# Patient Record
Sex: Female | Born: 1965 | Race: Black or African American | Hispanic: No | Marital: Single | Smoking: Never smoker
Health system: Southern US, Community
[De-identification: ages and names within clinical notes are randomized; demographics above are authoritative.]

## PROBLEM LIST (undated history)

## (undated) DIAGNOSIS — E049 Nontoxic goiter, unspecified: Secondary | ICD-10-CM

## (undated) HISTORY — DX: Nontoxic goiter, unspecified: E04.9

---

## 2001-07-29 ENCOUNTER — Emergency Department (HOSPITAL_COMMUNITY): Admission: EM | Admit: 2001-07-29 | Discharge: 2001-07-29 | Payer: Self-pay | Admitting: Emergency Medicine

## 2001-07-29 ENCOUNTER — Encounter: Payer: Self-pay | Admitting: Emergency Medicine

## 2007-07-16 ENCOUNTER — Emergency Department (HOSPITAL_COMMUNITY): Admission: EM | Admit: 2007-07-16 | Discharge: 2007-07-16 | Payer: Self-pay | Admitting: Emergency Medicine

## 2007-12-26 ENCOUNTER — Emergency Department (HOSPITAL_COMMUNITY): Admission: EM | Admit: 2007-12-26 | Discharge: 2007-12-26 | Payer: Self-pay | Admitting: Family Medicine

## 2011-01-08 ENCOUNTER — Other Ambulatory Visit: Payer: Self-pay | Admitting: Family Medicine

## 2011-01-08 DIAGNOSIS — N926 Irregular menstruation, unspecified: Secondary | ICD-10-CM

## 2011-01-13 ENCOUNTER — Other Ambulatory Visit: Payer: Self-pay

## 2011-01-26 ENCOUNTER — Other Ambulatory Visit: Payer: Self-pay

## 2011-01-26 ENCOUNTER — Ambulatory Visit
Admission: RE | Admit: 2011-01-26 | Discharge: 2011-01-26 | Disposition: A | Discharge status: Home / Self Care | Payer: 59 | Source: Ambulatory Visit | Attending: Family Medicine | Admitting: Family Medicine

## 2011-01-26 DIAGNOSIS — N926 Irregular menstruation, unspecified: Secondary | ICD-10-CM

## 2011-07-23 ENCOUNTER — Ambulatory Visit (INDEPENDENT_AMBULATORY_CARE_PROVIDER_SITE_OTHER): Payer: 59

## 2011-07-23 DIAGNOSIS — G44209 Tension-type headache, unspecified, not intractable: Secondary | ICD-10-CM

## 2011-07-23 DIAGNOSIS — J019 Acute sinusitis, unspecified: Secondary | ICD-10-CM

## 2011-07-23 DIAGNOSIS — B9789 Other viral agents as the cause of diseases classified elsewhere: Secondary | ICD-10-CM

## 2011-07-24 ENCOUNTER — Ambulatory Visit (INDEPENDENT_AMBULATORY_CARE_PROVIDER_SITE_OTHER): Payer: 59

## 2011-07-24 DIAGNOSIS — R51 Headache: Secondary | ICD-10-CM

## 2011-07-24 DIAGNOSIS — R112 Nausea with vomiting, unspecified: Secondary | ICD-10-CM

## 2011-10-01 ENCOUNTER — Encounter: Payer: Self-pay | Admitting: Family Medicine

## 2012-08-31 ENCOUNTER — Ambulatory Visit: Payer: 59 | Admitting: Family Medicine

## 2012-08-31 VITALS — BP 140/92 | HR 86 | Temp 98.1°F | Resp 16 | Ht 63.5 in | Wt 181.0 lb

## 2012-08-31 DIAGNOSIS — R5381 Other malaise: Secondary | ICD-10-CM

## 2012-08-31 DIAGNOSIS — J029 Acute pharyngitis, unspecified: Secondary | ICD-10-CM

## 2012-08-31 LAB — POCT CBC
Granulocyte percent: 44.1 %G (ref 37–80)
HCT, POC: 42.4 % (ref 37.7–47.9)
Hemoglobin: 13.5 g/dL (ref 12.2–16.2)
Lymph, poc: 1.8 (ref 0.6–3.4)
MCH, POC: 29.8 pg (ref 27–31.2)
MCHC: 31.8 g/dL (ref 31.8–35.4)
MCV: 93.6 fL (ref 80–97)
MID (cbc): 0.6 (ref 0–0.9)
MPV: 10.5 fL (ref 0–99.8)
POC Granulocyte: 1.9 — AB (ref 2–6.9)
POC LYMPH PERCENT: 42.3 %L (ref 10–50)
POC MID %: 13.6 %M — AB (ref 0–12)
Platelet Count, POC: 174 10*3/uL (ref 142–424)
RBC: 4.53 M/uL (ref 4.04–5.48)
RDW, POC: 15.6 %
WBC: 4.3 10*3/uL — AB (ref 4.6–10.2)

## 2012-08-31 MED ORDER — FIRST-DUKES MOUTHWASH MT SUSP: 5.0000 mL | Freq: Four times a day (QID) | OROMUCOSAL | Status: DC | PRN

## 2012-08-31 NOTE — Patient Instructions (Addendum)
Rest, drink plenty of fluids, and let me know if you are not better in the next 2 days.  You can use ibuprofen and/ or tylenol for your aches, and the throat wash as needed.   Let me know sooner if you get worse

## 2012-08-31 NOTE — Progress Notes (Signed)
[facility] [address] [place] [address] [phone]  Date:  08/31/2012   Name:  Tammy Dixon   DOB:  Feb 24, 1966   MRN:  [id number]  PCP:  No primary provider on file.    Chief Complaint: Sore Throat, Generalized Body Aches, Cough, Headache and Chills   History of Present Illness:  Tammy Dixon is a 47 y.o. very pleasant female patient who presents with the following:  Here today with illness which started on Sunday- today is Wednesday.   She has noted a sore throat, HA, pressure behind her eyes, sore/ tight muscles, hot and cold flashes.  She feels very tired.  She has a mild cough.   She uses an OTC sinus medication for this- however they seemed to make her feel worse No GI symptoms.   No medications yet today.    She is generally healthy.  Codeine causes nausea and vomiting but not itching/ swelling   There is no problem list on file for this patient.   History reviewed. No pertinent past medical history.  History reviewed. No pertinent past surgical history.  History  Substance Use Topics  . Smoking status: Never Smoker   . Smokeless tobacco: Not on file  . Alcohol Use: No    History reviewed. No pertinent family history.  Allergies  Allergen Reactions  . Codeine Nausea And Vomiting    Medication list has been reviewed and updated.  No current outpatient prescriptions on file prior to visit.    Review of Systems:  As per HPI- otherwise negative.   Physical Examination: Filed Vitals:   08/31/12 1228  BP: 140/92  Pulse: 86  Resp: 16   Filed Vitals:   08/31/12 1228  Height: 5' 3.5" (1.613 m)  Weight: 181 lb (82.101 kg)   Body mass index is 31.56 kg/(m^2). Ideal Body Weight: Weight in (lb) to have BMI = 25: 143.1   GEN: WDWN, NAD, Non-toxic, A & O x 3 HEENT: Atraumatic, Normocephalic. Neck supple. No masses, No LAD. Ears and Nose: No external deformity. CV: RRR, No M/G/R. No JVD. No thrill. No extra heart  sounds. PULM: CTA B, no wheezes, crackles, rhonchi. No retractions. No resp. distress. No accessory muscle use. ABD: S, NT, ND, +BS. No rebound. No HSM. EXTR: No c/c/e NEURO Normal gait.  PSYCH: Normally interactive. Conversant. Not depressed or anxious appearing.  Calm demeanor.   Results for orders placed in visit on 08/31/12  POCT CBC      Component Value Range   WBC 4.3 (*) 4.6 - 10.2 K/uL   Lymph, poc 1.8  0.6 - 3.4   POC LYMPH PERCENT 42.3  10 - 50 %L   MID (cbc) 0.6  0 - 0.9   POC MID % 13.6 (*) 0 - 12 %M   POC Granulocyte 1.9 (*) 2 - 6.9   Granulocyte percent 44.1  37 - 80 %G   RBC 4.53  4.04 - 5.48 M/uL   Hemoglobin 13.5  12.2 - 16.2 g/dL   HCT, POC 52.8  41.3 - 47.9 %   MCV 93.6  80 - 97 fL   MCH, POC 29.8  27 - 31.2 pg   MCHC 31.8  31.8 - 35.4 g/dL   RDW, POC 24.4     Platelet Count, POC 174  142 - 424 K/uL   MPV 10.5  0 - 99.8 fL    Assessment and Plan: 1. Malaise  POCT CBC  2.  Sore throat  Diphenhyd-Hydrocort-Nystatin (FIRST-DUKES MOUTHWASH) SUSP   Here today with illness- suspect she has had the flu.  She perfers not to undergo a nasal swab today.  It is too late for tamiflu to be very helpful.  Will treat with DMM prn, and rest/ supportive care.  She is to let me know if not better in the next 2 days, Sooner if worse.     Abbe Amsterdam, MD

## 2012-09-02 ENCOUNTER — Ambulatory Visit: Payer: 59 | Admitting: Internal Medicine

## 2012-09-02 VITALS — BP 138/94 | HR 115 | Temp 99.6°F | Resp 17 | Ht 65.0 in | Wt 177.0 lb

## 2012-09-02 DIAGNOSIS — J329 Chronic sinusitis, unspecified: Secondary | ICD-10-CM

## 2012-09-02 DIAGNOSIS — J111 Influenza due to unidentified influenza virus with other respiratory manifestations: Secondary | ICD-10-CM

## 2012-09-02 DIAGNOSIS — H66009 Acute suppurative otitis media without spontaneous rupture of ear drum, unspecified ear: Secondary | ICD-10-CM

## 2012-09-02 DIAGNOSIS — R110 Nausea: Secondary | ICD-10-CM

## 2012-09-02 MED ORDER — AMOXICILLIN 875 MG PO TABS: 875.0000 mg | ORAL_TABLET | Freq: Two times a day (BID) | ORAL | Status: DC

## 2012-09-02 MED ORDER — MELOXICAM 15 MG PO TABS: 15.0000 mg | ORAL_TABLET | Freq: Every day | ORAL | Status: DC

## 2012-09-02 MED ORDER — ONDANSETRON HCL 4 MG PO TABS
8.0000 mg | ORAL_TABLET | Freq: Once | ORAL | Status: AC
  Administered 2012-09-02: 8 mg via ORAL

## 2012-09-02 MED ORDER — ONDANSETRON HCL 4 MG PO TABS: 4.0000 mg | ORAL_TABLET | Freq: Three times a day (TID) | ORAL | Status: DC | PRN

## 2012-09-04 NOTE — Progress Notes (Signed)
  Subjective:    Patient ID: Tammy Dixon, female    DOB: [birth year], 47 y.o.   MRN: [id number]  HPIsee ov 1/22 She is worse now with nausea, continued malaise, mild sore throat, headache, aching in muscles all over particularly shoulders, and fatigue. She continues to have chills and intermittent fever. There is no vomiting. Cough is minimal. She has had no vomiting. The headache is mainly frontal. It's worse with bending over. She has significant congestion and trouble breathing at night. She is purulent discharge. She has tried to work the last 2 days and is greatly fatigued and aching all over the  Review of Systems     Objective:   Physical Exam No acute distress Temperature 99.6 eyes clear/TMs clear/nares congested with purulent mucus /bilateral maxillary sinus tenderness tenderness to percussion  throat clear/no nodes Chest clear to auscultation Heart regular with pulse of 100/no murmur Good range of motion of neck and shoulder Abdomen is benign      Assessment & Plan:  Impression #1 influenza  #2 sinus infection as a complication  #3 nausea as a complication Given 8 mg of Zofran in the clinic 2 push clear liquids at home and advance as tolerated Amoxicillin 875 twice a day Zofran 4 mg every 6-8 hours as needed Mobic 15 mg daily if needed for headache or myalgias Recheck in 5 days if not well Out of work until Tuesday

## 2013-04-12 ENCOUNTER — Ambulatory Visit: Payer: 59

## 2013-04-12 ENCOUNTER — Ambulatory Visit: Payer: 59 | Admitting: Family Medicine

## 2013-04-12 VITALS — BP 118/86 | HR 64 | Temp 98.2°F | Resp 18 | Ht 63.0 in | Wt 181.0 lb

## 2013-04-12 DIAGNOSIS — M25569 Pain in unspecified knee: Secondary | ICD-10-CM

## 2013-04-12 DIAGNOSIS — R52 Pain, unspecified: Secondary | ICD-10-CM

## 2013-04-12 DIAGNOSIS — M722 Plantar fascial fibromatosis: Secondary | ICD-10-CM

## 2013-04-12 DIAGNOSIS — M25561 Pain in right knee: Secondary | ICD-10-CM

## 2013-04-12 LAB — COMPREHENSIVE METABOLIC PANEL WITH GFR
ALT: 14 U/L (ref 0–35)
AST: 19 U/L (ref 0–37)
Albumin: 4.5 g/dL (ref 3.5–5.2)
Alkaline Phosphatase: 77 U/L (ref 39–117)
Potassium: 4 meq/L (ref 3.5–5.3)
Sodium: 139 meq/L (ref 135–145)
Total Bilirubin: 0.5 mg/dL (ref 0.3–1.2)
Total Protein: 7.2 g/dL (ref 6.0–8.3)

## 2013-04-12 LAB — COMPREHENSIVE METABOLIC PANEL
BUN: 10 mg/dL (ref 6–23)
CO2: 27 mEq/L (ref 19–32)
Calcium: 9.3 mg/dL (ref 8.4–10.5)
Chloride: 105 mEq/L (ref 96–112)
Creat: 0.61 mg/dL (ref 0.50–1.10)
Glucose, Bld: 73 mg/dL (ref 70–99)

## 2013-04-12 LAB — POCT GLYCOSYLATED HEMOGLOBIN (HGB A1C): Hemoglobin A1C: 5.2

## 2013-04-12 MED ORDER — MELOXICAM 7.5 MG PO TABS: 7.5000 mg | ORAL_TABLET | Freq: Every day | ORAL | Status: DC

## 2013-04-12 NOTE — Progress Notes (Signed)
[facility]:  Office Visit  Chief Complaint:  Chief Complaint  Patient presents with  . Leg Pain    both legs- 2 weeks    HPI: Tammy Dixon is a 47 y.o. female who complains of  2 week history of bilateral leg pain, "stiffness and burning sensation". She has this with or without walking, exertion or at rest. She denies DM. She denies STDs. She denies fevers, chills, eye problems, vision issues, or rashes. Has tried tylenol and ibuprofen . She has AM pain. No prior leg pain. She also has morning pain when she wakes up before she puts her feet on the ground, the pain is sometimes at the bottom of her feet bilaterally, she has pain in bilateral calf without swelling, she walks a lot for her job. No new foot wear. No risk factors for DVT, no swelling, aysmmetry.  History reviewed. No pertinent past medical history. History reviewed. No pertinent past surgical history. History   Social History  . Marital Status: Single    Spouse Name: N/A    Number of Children: N/A  . Years of Education: N/A   Social History Main Topics  . Smoking status: Never Smoker   . Smokeless tobacco: None  . Alcohol Use: No  . Drug Use: None  . Sexual Activity: None   Other Topics Concern  . None   Social History Narrative  . None   No family history on file. Allergies  Allergen Reactions  . Codeine Nausea And Vomiting   Prior to Admission medications   Not on File     ROS: The patient denies fevers, chills, night sweats, unintentional weight loss, chest pain, palpitations, wheezing, dyspnea on exertion, nausea, vomiting, abdominal pain, dysuria, hematuria, melena, weakness  All other systems have been reviewed and were otherwise negative with the exception of those mentioned in the HPI and as above.    PHYSICAL EXAM: Filed Vitals:   04/12/13 1310  BP: 118/86  Pulse: 64  Temp: 98.2 F (36.8 C)  Resp: 18   Filed Vitals:   04/12/13 1310  Height: 5\' 3"  (1.6 m)   Weight: 181 lb (82.101 kg)   Body mass index is 32.07 kg/(m^2).  General: Alert, no acute distress HEENT:  Normocephalic, atraumatic, oropharynx patent. EOMI, PERRLA Cardiovascular:  Regular rate and rhythm, no rubs murmurs or gallops.  No Carotid bruits, radial pulse intact. No pedal edema.  Respiratory: Clear to auscultation bilaterally.  No wheezes, rales, or rhonchi.  No cyanosis, no use of accessory musculature GI: No organomegaly, abdomen is soft and non-tender, positive bowel sounds.  No masses. Skin: No rashes. Neurologic: Facial musculature symmetric. Psychiatric: Patient is appropriate throughout our interaction. Lymphatic: No cervical lymphadenopathy Musculoskeletal: Gait intact. She has lateral knee tenderness along patellar bilaterally 5/5 strength, neg anterior drawer, mcmurray, lachman tests, stable to varus and valgus sress.   + DP, posterior tib, good cap refill. No crepitus or swelling  + tenderness along medial aspect of PF and also along achilles bilateraly NOrmal Dorsi and plantar flexion without pain   LABS: Results for orders placed in visit on 04/12/13  POCT GLYCOSYLATED HEMOGLOBIN (HGB A1C)      Result Value Range   Hemoglobin A1C 5.2       EKG/XRAY:   Primary read interpreted by Dr. Conley Rolls at [facility]. No fx.dislocation   ASSESSMENT/PLAN:3 Encounter Diagnoses  Name Primary?  . Burning pain Yes  . Knee pain, bilateral   . Plantar fasciitis, bilateral  Rx mobic PT if no improvement PF exercise and change shoes, encourage weight loss. ? Combination of PF and mild arthritic changes of knee. She does not have DM. I did not screen her for STDs.  Gross sideeffects, risk and benefits, and alternatives of medications d/w patient. Patient is aware that all medications have potential sideeffects and we are unable to predict every sideeffect or drug-drug interaction that may occur.  Torres Hardenbrook PHUONG, DO 04/12/2013 3:12 PM

## 2013-04-12 NOTE — Patient Instructions (Addendum)
Plantar Fasciitis (Heel Spur Syndrome) with Rehab The plantar fascia is a fibrous, ligament-like, soft-tissue structure that spans the bottom of the foot. Plantar fasciitis is a condition that causes pain in the foot due to inflammation of the tissue. SYMPTOMS   Pain and tenderness on the underneath side of the foot.  Pain that worsens with standing or walking. CAUSES  Plantar fasciitis is caused by irritation and injury to the plantar fascia on the underneath side of the foot. Common mechanisms of injury include:  Direct trauma to bottom of the foot.  Damage to a small nerve that runs under the foot where the main fascia attaches to the heel bone.  Stress placed on the plantar fascia due to bone spurs. RISK INCREASES WITH:   Activities that place stress on the plantar fascia (running, jumping, pivoting, or cutting).  Poor strength and flexibility.  Improperly fitted shoes.  Tight calf muscles.  Flat feet.  Failure to warm-up properly before activity.  Obesity. PREVENTION  Warm up and stretch properly before activity.  Allow for adequate recovery between workouts.  Maintain physical fitness:  Strength, flexibility, and endurance.  Cardiovascular fitness.  Maintain a health body weight.  Avoid stress on the plantar fascia.  Wear properly fitted shoes, including arch supports for individuals who have flat feet. PROGNOSIS  If treated properly, then the symptoms of plantar fasciitis usually resolve without surgery. However, occasionally surgery is necessary. RELATED COMPLICATIONS   Recurrent symptoms that may result in a chronic condition.  Problems of the lower back that are caused by compensating for the injury, such as limping.  Pain or weakness of the foot during push-off following surgery.  Chronic inflammation, scarring, and partial or complete fascia tear, occurring more often from repeated injections. TREATMENT  Treatment initially involves the use of  ice and medication to help reduce pain and inflammation. The use of strengthening and stretching exercises may help reduce pain with activity, especially stretches of the Achilles tendon. These exercises may be performed at home or with a therapist. Your caregiver may recommend that you use heel cups of arch supports to help reduce stress on the plantar fascia. Occasionally, corticosteroid injections are given to reduce inflammation. If symptoms persist for greater than 6 months despite non-surgical (conservative), then surgery may be recommended.  MEDICATION   If pain medication is necessary, then nonsteroidal anti-inflammatory medications, such as aspirin and ibuprofen, or other minor pain relievers, such as acetaminophen, are often recommended.  Do not take pain medication within 7 days before surgery.  Prescription pain relievers may be given if deemed necessary by your caregiver. Use only as directed and only as much as you need.  Corticosteroid injections may be given by your caregiver. These injections should be reserved for the most serious cases, because they may only be given a certain number of times. HEAT AND COLD  Cold treatment (icing) relieves pain and reduces inflammation. Cold treatment should be applied for 10 to 15 minutes every 2 to 3 hours for inflammation and pain and immediately after any activity that aggravates your symptoms. Use ice packs or massage the area with a piece of ice (ice massage).  Heat treatment may be used prior to performing the stretching and strengthening activities prescribed by your caregiver, physical therapist, or athletic trainer. Use a heat pack or soak the injury in warm water. SEEK IMMEDIATE MEDICAL CARE IF:  Treatment seems to offer no benefit, or the condition worsens.  Any medications produce adverse side effects. EXERCISES RANGE   OF MOTION (ROM) AND STRETCHING EXERCISES - Plantar Fasciitis (Heel Spur Syndrome) These exercises may help you  when beginning to rehabilitate your injury. Your symptoms may resolve with or without further involvement from your physician, physical therapist or athletic trainer. While completing these exercises, remember:   Restoring tissue flexibility helps normal motion to return to the joints. This allows healthier, less painful movement and activity.  An effective stretch should be held for at least 30 seconds.  A stretch should never be painful. You should only feel a gentle lengthening or release in the stretched tissue. RANGE OF MOTION - Toe Extension, Flexion  Sit with your right / left leg crossed over your opposite knee.  Grasp your toes and gently pull them back toward the top of your foot. You should feel a stretch on the bottom of your toes and/or foot.  Hold this stretch for __________ seconds.  Now, gently pull your toes toward the bottom of your foot. You should feel a stretch on the top of your toes and or foot.  Hold this stretch for __________ seconds. Repeat __________ times. Complete this stretch __________ times per day.  RANGE OF MOTION - Ankle Dorsiflexion, Active Assisted  Remove shoes and sit on a chair that is preferably not on a carpeted surface.  Place right / left foot under knee. Extend your opposite leg for support.  Keeping your heel down, slide your right / left foot back toward the chair until you feel a stretch at your ankle or calf. If you do not feel a stretch, slide your bottom forward to the edge of the chair, while still keeping your heel down.  Hold this stretch for __________ seconds. Repeat __________ times. Complete this stretch __________ times per day.  STRETCH  Gastroc, Standing  Place hands on wall.  Extend right / left leg, keeping the front knee somewhat bent.  Slightly point your toes inward on your back foot.  Keeping your right / left heel on the floor and your knee straight, shift your weight toward the wall, not allowing your back to  arch.  You should feel a gentle stretch in the right / left calf. Hold this position for __________ seconds. Repeat __________ times. Complete this stretch __________ times per day. STRETCH  Soleus, Standing  Place hands on wall.  Extend right / left leg, keeping the other knee somewhat bent.  Slightly point your toes inward on your back foot.  Keep your right / left heel on the floor, bend your back knee, and slightly shift your weight over the back leg so that you feel a gentle stretch deep in your back calf.  Hold this position for __________ seconds. Repeat __________ times. Complete this stretch __________ times per day. STRETCH  Gastrocsoleus, Standing  Note: This exercise can place a lot of stress on your foot and ankle. Please complete this exercise only if specifically instructed by your caregiver.   Place the ball of your right / left foot on a step, keeping your other foot firmly on the same step.  Hold on to the wall or a rail for balance.  Slowly lift your other foot, allowing your body weight to press your heel down over the edge of the step.  You should feel a stretch in your right / left calf.  Hold this position for __________ seconds.  Repeat this exercise with a slight bend in your right / left knee. Repeat __________ times. Complete this stretch __________ times per day.    STRENGTHENING EXERCISES - Plantar Fasciitis (Heel Spur Syndrome)  These exercises may help you when beginning to rehabilitate your injury. They may resolve your symptoms with or without further involvement from your physician, physical therapist or athletic trainer. While completing these exercises, remember:   Muscles can gain both the endurance and the strength needed for everyday activities through controlled exercises.  Complete these exercises as instructed by your physician, physical therapist or athletic trainer. Progress the resistance and repetitions only as guided. STRENGTH - Towel  Curls  Sit in a chair positioned on a non-carpeted surface.  Place your foot on a towel, keeping your heel on the floor.  Pull the towel toward your heel by only curling your toes. Keep your heel on the floor.  If instructed by your physician, physical therapist or athletic trainer, add ____________________ at the end of the towel. Repeat __________ times. Complete this exercise __________ times per day. STRENGTH - Ankle Inversion  Secure one end of a rubber exercise band/tubing to a fixed object (table, pole). Loop the other end around your foot just before your toes.  Place your fists between your knees. This will focus your strengthening at your ankle.  Slowly, pull your big toe up and in, making sure the band/tubing is positioned to resist the entire motion.  Hold this position for __________ seconds.  Have your muscles resist the band/tubing as it slowly pulls your foot back to the starting position. Repeat __________ times. Complete this exercises __________ times per day.  Document Released: 07/27/2005 Document Revised: 10/19/2011 Document Reviewed: 11/08/2008 [facility] Patient Information 2014 [place], [place]. Degenerative Arthritis You have osteoarthritis. This is the wear and tear arthritis that comes with aging. It is also called degenerative arthritis. This is common in people past middle age. It is caused by stress on the joints. The large weight bearing joints of the lower extremities are most often affected. The knees, hips, back, neck, and hands can become painful, swollen, and stiff. This is the most common type of arthritis. It comes on with age, carrying too much weight, or from an injury. Treatment includes resting the sore joint until the pain and swelling improve. Crutches or a walker may be needed for severe flares. Only take over-the-counter or prescription medicines for pain, discomfort, or fever as directed by your caregiver. Local heat therapy may improve motion.  Cortisone shots into the joint are sometimes used to reduce pain and swelling during flares. Osteoarthritis is usually not crippling and progresses slowly. There are things you can do to decrease pain:  Avoid high impact activities.  Exercise regularly.  Low impact exercises such as walking, biking and swimming help to keep the muscles strong and keep normal joint function.  Stretching helps to keep your range of motion.  Lose weight if you are overweight. This reduces joint stress. In severe cases when you have pain at rest or increasing disability, joint surgery may be helpful. See your caregiver for follow-up treatment as recommended.  SEEK IMMEDIATE MEDICAL CARE IF:   You have severe joint pain.  Marked swelling and redness in your joint develops.  You develop a high fever. Document Released: 07/27/2005 Document Revised: 10/19/2011 Document Reviewed: 12/27/2006 [facility] Patient Information 2014 [place], [place].

## 2013-04-13 LAB — VITAMIN D 25 HYDROXY (VIT D DEFICIENCY, FRACTURES): Vit D, 25-Hydroxy: 24 ng/mL — ABNORMAL LOW (ref 30–89)

## 2013-04-27 ENCOUNTER — Encounter: Payer: Self-pay | Admitting: Family Medicine

## 2013-05-01 ENCOUNTER — Ambulatory Visit (INDEPENDENT_AMBULATORY_CARE_PROVIDER_SITE_OTHER): Payer: 59 | Admitting: Family Medicine

## 2013-05-01 VITALS — BP 120/80 | HR 73 | Temp 98.0°F | Resp 17 | Ht 63.0 in | Wt 178.0 lb

## 2013-05-01 DIAGNOSIS — E559 Vitamin D deficiency, unspecified: Secondary | ICD-10-CM

## 2013-05-01 DIAGNOSIS — M26609 Unspecified temporomandibular joint disorder, unspecified side: Secondary | ICD-10-CM

## 2013-05-01 NOTE — Patient Instructions (Addendum)
It appears that you have TMJ, a very common cause of jaw pain.  NSAID medications such as ibuprofen or mobic can be helpful.  Try to rest your jaw by eating soft foods for the next few days.  If you continue to have trouble your dentist may fit you with a mouth guard to wear at night.    Continue to stay active and try to lose a few lbs to protect your knees.    Your vitamin D level was slightly low.  Try taking an OTC vitamin D supplement that contains 1,000 or 2,000 IU of vitamin D daily

## 2013-05-01 NOTE — Progress Notes (Signed)
[facility] [address] [place] [address] [phone]  Date:  05/01/2013   Name:  Tammy Dixon   DOB:  1965/09/17   MRN:  [id number]  PCP:  Abbe Amsterdam, MD    Chief Complaint: Knee Pain and Neck Pain   History of Present Illness:  Tammy Dixon is a 47 y.o. very pleasant female patient who presents with the following:  She was here a couple of weeks ago and saw Dr. Conley Rolls for leg/ knee pain.  She had x-rays and BW.  She took mobic for a few days and feels better.  Her knees still hurt sometimes if she sits for a long period.  Her pain now comes and goes.  She is able to use a tandem gait on stairs again (she was stepping up one leg at a time).    Then about 8 days ago she noted a "nerve pinch" in her right temple.  This lasted just a moment and resolved.  Later that day she noted a tender and swollen feeling in her face.  She also had pain when she tried to chew on the right side.   She continues to have this problems and is afraid it may be something dangerous.    She also has felt "really tired" for the last couple of weeks.    LMP 04/06/13.   She wears dentures, and only has one natural tooth on the right side.  She has not had problems with her dentures in the past.    She was noted to have a slightly low Vit D level at her last lab visit.  She is not sure what to do about this.   There are no active problems to display for this patient.   No past medical history on file.  No past surgical history on file.  History  Substance Use Topics  . Smoking status: Never Smoker   . Smokeless tobacco: Not on file  . Alcohol Use: No    No family history on file.  Allergies  Allergen Reactions  . Codeine Nausea And Vomiting    Medication list has been reviewed and updated.  No current outpatient prescriptions on file prior to visit.   No current facility-administered medications on file prior to visit.    Review of Systems:  As per HPI-  otherwise negative.   Physical Examination: Filed Vitals:   05/01/13 1149  BP: 120/80  Pulse: 73  Temp: 98 F (36.7 C)  Resp: 17   Filed Vitals:   05/01/13 1149  Height: 5\' 3"  (1.6 m)  Weight: 178 lb (80.74 kg)   Body mass index is 31.54 kg/(m^2). Ideal Body Weight: Weight in (lb) to have BMI = 25: 140.8  GEN: WDWN, NAD, Non-toxic, A & O x 3, overweight, looks well HEENT: Atraumatic, Normocephalic. Neck supple. No masses, No LAD.  Bilateral TM wnl, oropharynx normal.  PEERL,EOMI.   Wearing dentures, but no tenderness in her teeth.  She is tender over the right TMJ- no swelling, heat or redness.  She has tenderness with opening and closing her jaw when I press on the right TMJ Ears and Nose: No external deformity. CV: RRR, No M/G/R. No JVD. No thrill. No extra heart sounds. PULM: CTA B, no wheezes, crackles, rhonchi. No retractions. No resp. distress. No accessory muscle use. EXTR: No c/c/e NEURO Normal gait.  PSYCH: Normally interactive. Conversant. Not depressed or anxious appearing.  Calm demeanor.    Assessment  and Plan: TMJ (temporomandibular joint syndrome)  Unspecified vitamin D deficiency  Reassured [place] that she seems to have TMJ.  This is unlikely to be anything serious. She will use mobic as needed and eat soft foods.  She has a dentist who she will call for follow-up.   Discussed OTC vitamin D supplementation for replacement.  Recheck in a few months.    Signed Abbe Amsterdam, MD

## 2013-06-08 ENCOUNTER — Other Ambulatory Visit: Payer: Self-pay | Admitting: Internal Medicine

## 2013-06-08 DIAGNOSIS — E010 Iodine-deficiency related diffuse (endemic) goiter: Secondary | ICD-10-CM

## 2013-06-09 ENCOUNTER — Ambulatory Visit
Admission: RE | Admit: 2013-06-09 | Discharge: 2013-06-09 | Disposition: A | Discharge status: Home / Self Care | Payer: 59 | Source: Ambulatory Visit | Attending: Internal Medicine | Admitting: Internal Medicine

## 2013-06-09 DIAGNOSIS — E010 Iodine-deficiency related diffuse (endemic) goiter: Secondary | ICD-10-CM

## 2013-06-12 ENCOUNTER — Other Ambulatory Visit: Payer: Self-pay | Admitting: Internal Medicine

## 2013-06-12 DIAGNOSIS — E042 Nontoxic multinodular goiter: Secondary | ICD-10-CM

## 2013-06-14 ENCOUNTER — Ambulatory Visit
Admission: RE | Admit: 2013-06-14 | Discharge: 2013-06-14 | Disposition: A | Discharge status: Home / Self Care | Payer: 59 | Source: Ambulatory Visit | Attending: Internal Medicine | Admitting: Internal Medicine

## 2013-06-14 ENCOUNTER — Other Ambulatory Visit (HOSPITAL_COMMUNITY)
Admission: RE | Admit: 2013-06-14 | Discharge: 2013-06-14 | Disposition: A | Discharge status: Home / Self Care | Payer: 59 | Source: Ambulatory Visit | Attending: Interventional Radiology | Admitting: Interventional Radiology

## 2013-06-14 DIAGNOSIS — E042 Nontoxic multinodular goiter: Secondary | ICD-10-CM

## 2013-06-14 DIAGNOSIS — E041 Nontoxic single thyroid nodule: Secondary | ICD-10-CM | POA: Insufficient documentation

## 2013-06-16 ENCOUNTER — Encounter (HOSPITAL_COMMUNITY): Payer: 59

## 2013-06-20 ENCOUNTER — Encounter (HOSPITAL_COMMUNITY): Payer: 59

## 2013-06-21 ENCOUNTER — Encounter (INDEPENDENT_AMBULATORY_CARE_PROVIDER_SITE_OTHER): Payer: Self-pay | Admitting: General Surgery

## 2013-06-21 ENCOUNTER — Ambulatory Visit (INDEPENDENT_AMBULATORY_CARE_PROVIDER_SITE_OTHER): Payer: 59 | Admitting: General Surgery

## 2013-06-21 VITALS — BP 130/84 | HR 66 | Temp 97.8°F | Resp 16 | Ht 64.0 in | Wt 180.4 lb

## 2013-06-21 DIAGNOSIS — E041 Nontoxic single thyroid nodule: Secondary | ICD-10-CM | POA: Insufficient documentation

## 2013-06-21 NOTE — Progress Notes (Signed)
Subjective:     Patient ID: Tammy Dixon, female   DOB: [birth year], 47 y.o.   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 The patient states that she is interposition secondary to leg pain. Upon examination patient states to have a thyroid nodule she underwent ultrasound. Ultrasound revealed a dominant left 4.4 cm nodule of the left lobe as well as a 1.4 cm right lobe nodule. Both these went underwent FNA biopsy for left lobe nodule revealed follicular cells that could be benign thyroid neoplasm, a follicular neoplasm or ovarian or papillary thyroid carcinoma. Her right-sided nodule was nonneoplastic goiter. The patient was thus referred for surgical consultation.  The patient otherwise states she has no previous symptoms prior to her exam. Of note the patient does state that her mother had her thyroid removed but is unsure of the reason.  Review of Systems  Constitutional: Negative.   HENT: Negative.   Respiratory: Negative.   Cardiovascular: Negative.   Gastrointestinal: Negative.   Neurological: Negative.   All other systems reviewed and are negative.       Objective:   Physical Exam  Constitutional: She is oriented to person, place, and time. She appears well-developed and well-nourished.  HENT:  Head: Normocephalic and atraumatic.  Eyes: Conjunctivae and EOM are normal. Pupils are equal, round, and reactive to light.  Neck: Normal range of motion. Neck supple. Mass (left-sided greater than right-sided) and thyromegaly present.  Cardiovascular: Normal rate, regular rhythm and normal heart sounds.   Pulmonary/Chest: Effort normal and breath sounds normal.  Abdominal: Soft. Bowel sounds are normal.  Musculoskeletal: Normal range of motion.  Neurological: She is alert and oriented to person, place, and time.  Skin: Skin is warm and dry.       Assessment:     47 row  female with a left thyroid nodule with questionable benign thyroid nodule versus follicular neoplasm, or follicular variant of papillary.       Plan:     1. Discussed with the patient the significance of the pathology and the fact that they're unsure whether not this could be a follicular carcinoma. I discussed with her that we can undergo surgery to remove it and a definite diagnosis. The option would be to follow up with ultrasound in 3-6 months. At this time she is unsure whether or not she would like to have her hemithyroidectomy. She would like to discuss this with her family and calls back for scheduling or any other questions.

## 2013-06-22 ENCOUNTER — Other Ambulatory Visit (HOSPITAL_COMMUNITY): Payer: Self-pay | Admitting: Internal Medicine

## 2013-06-22 ENCOUNTER — Ambulatory Visit (HOSPITAL_COMMUNITY)
Admission: RE | Admit: 2013-06-22 | Discharge: 2013-06-22 | Disposition: A | Discharge status: Home / Self Care | Payer: 59 | Source: Ambulatory Visit | Attending: Vascular Surgery | Admitting: Vascular Surgery

## 2013-06-22 DIAGNOSIS — M79609 Pain in unspecified limb: Secondary | ICD-10-CM

## 2013-07-18 ENCOUNTER — Encounter (INDEPENDENT_AMBULATORY_CARE_PROVIDER_SITE_OTHER): Payer: Self-pay | Admitting: General Surgery

## 2013-07-18 ENCOUNTER — Ambulatory Visit (INDEPENDENT_AMBULATORY_CARE_PROVIDER_SITE_OTHER): Payer: 59 | Admitting: General Surgery

## 2013-07-18 ENCOUNTER — Telehealth (INDEPENDENT_AMBULATORY_CARE_PROVIDER_SITE_OTHER): Payer: Self-pay

## 2013-07-18 VITALS — BP 128/74 | HR 68 | Temp 98.0°F | Resp 18 | Ht 65.0 in | Wt 181.0 lb

## 2013-07-18 DIAGNOSIS — E041 Nontoxic single thyroid nodule: Secondary | ICD-10-CM

## 2013-07-18 NOTE — Addendum Note (Signed)
Addended by: Axel Filler on: 07/18/2013 04:25 PM   Modules accepted: Orders

## 2013-07-18 NOTE — Telephone Encounter (Signed)
Called and left message for patient to see if she can come in at 4:15 today to see Dr. Derrell Lolling

## 2013-07-18 NOTE — Progress Notes (Signed)
Patient ID: Tammy Dixon, female   DOB: 07-13-1966, 47 y.o.   MRN: [id number]  Chief Complaint  Patient presents with  . Follow-up    hemi thy    HPI Tammy Dixon is a 47 y.o. female.   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 The patient states that she is interposition secondary to leg pain. Upon examination patient states to have a thyroid nodule she underwent ultrasound. Ultrasound revealed a dominant left 4.4 cm nodule of the left lobe as well as a 1.4 cm right lobe MNG per bx. Both these went underwent FNA biopsy for left lobe nodule revealed follicular cells that could be benign thyroid neoplasm, a follicular neoplasm or  papillary thyroid carcinoma. Her right-sided nodule was nonneoplastic goiter. The patient was thus referred for surgical consultation.  The patient otherwise states she has no previous symptoms prior to her exam. Of note the patient does state that her mother had her thyroid removed but is unsure of the reason. HPI  Past Medical History  Diagnosis Date  . Goiter     History reviewed. No pertinent past surgical history.  History reviewed. No pertinent family history.  Social History History  Substance Use Topics  . Smoking status: Never Smoker   . Smokeless tobacco: Not on file  . Alcohol Use: No    Allergies  Allergen Reactions  . Codeine Nausea And Vomiting    Current Outpatient Prescriptions  Medication Sig Dispense Refill  . cholecalciferol (VITAMIN D) 1000 UNITS tablet Take 1,000 Units by mouth 2 (two) times daily.      . meloxicam (MOBIC) 7.5 MG tablet Take 7.5 mg by mouth daily.      . methylPREDNISolone (MEDROL) 4 MG tablet Take 4 mg by mouth daily.       No current facility-administered medications for this visit.    Review of Systems Review of Systems  Constitutional: Negative.   HENT: Negative.   Respiratory: Negative.   Cardiovascular:  Negative.   Gastrointestinal: Negative.   Endocrine: Negative for cold intolerance and heat intolerance.  Genitourinary: Negative.   Neurological: Negative.   Hematological: Negative.     Blood pressure 128/74, pulse 68, temperature 98 F (36.7 C), resp. rate 18, height 5\' 5"  (1.651 m), weight 181 lb (82.101 kg).  Physical Exam Physical Exam Constitutional: She is oriented to person, place, and time. She appears well-developed and well-nourished.  HENT:  Head: Normocephalic and atraumatic.  Eyes: Conjunctivae and EOM are normal. Pupils are equal, round, and reactive to light.  Neck: Normal range of motion. Neck supple. Mass (left-sided greater than right-sided) and thyromegaly present.  Cardiovascular: Normal rate, regular rhythm and normal heart sounds.   Pulmonary/Chest: Effort normal and breath sounds normal.  Abdominal: Soft. Bowel sounds are normal.  Musculoskeletal: Normal range of motion.  Neurological: She is alert and oriented to person, place, and time.  Skin: Skin is warm and dry.   Data Reviewed As above  Assessment    The patient is a 13 -year-old female with a left-sided nodule and biopsy resulting of follicular cells.     Plan    1. we'll proceed to operative for a left-sided hemithyroidectomy   2. All risks and benefits were discussed with the patient to generally include: infection, bleeding, damage to the recurrent laryngeal nerve, damage to superior and inferior parathyroid glands.  Alternatives were offered and described.  All questions were answered  and the patient voiced understanding of the procedure and wishes to proceed at this point with a laparoscopic cholecystectomy        Tammy Dixon., Tammy Dixon 07/18/2013, 4:18 PM

## 2013-07-24 ENCOUNTER — Telehealth (INDEPENDENT_AMBULATORY_CARE_PROVIDER_SITE_OTHER): Payer: Self-pay | Admitting: General Surgery

## 2013-07-24 NOTE — Telephone Encounter (Signed)
07/24/13 I spoke with patient and went over her benefits and financial responsibility. She will call me back when she is ready to schedule. skm

## 2014-08-29 ENCOUNTER — Emergency Department (HOSPITAL_COMMUNITY)
Admission: EM | Admit: 2014-08-29 | Discharge: 2014-08-29 | Disposition: A | Discharge status: Home / Self Care | Payer: 59 | Attending: Emergency Medicine | Admitting: Emergency Medicine

## 2014-08-29 ENCOUNTER — Encounter (HOSPITAL_COMMUNITY): Payer: Self-pay | Admitting: Emergency Medicine

## 2014-08-29 DIAGNOSIS — Z7952 Long term (current) use of systemic steroids: Secondary | ICD-10-CM | POA: Diagnosis not present

## 2014-08-29 DIAGNOSIS — R51 Headache: Secondary | ICD-10-CM | POA: Diagnosis not present

## 2014-08-29 DIAGNOSIS — Z791 Long term (current) use of non-steroidal anti-inflammatories (NSAID): Secondary | ICD-10-CM | POA: Diagnosis not present

## 2014-08-29 DIAGNOSIS — Z8639 Personal history of other endocrine, nutritional and metabolic disease: Secondary | ICD-10-CM | POA: Diagnosis not present

## 2014-08-29 DIAGNOSIS — K088 Other specified disorders of teeth and supporting structures: Secondary | ICD-10-CM | POA: Insufficient documentation

## 2014-08-29 DIAGNOSIS — Z79899 Other long term (current) drug therapy: Secondary | ICD-10-CM | POA: Insufficient documentation

## 2014-08-29 DIAGNOSIS — K0889 Other specified disorders of teeth and supporting structures: Secondary | ICD-10-CM

## 2014-08-29 MED ORDER — ONDANSETRON 4 MG PO TBDP
4.0000 mg | ORAL_TABLET | Freq: Once | ORAL | Status: AC
  Administered 2014-08-29: 4 mg via ORAL
  Filled 2014-08-29: qty 1

## 2014-08-29 MED ORDER — ONDANSETRON HCL 4 MG PO TABS: 4.0000 mg | ORAL_TABLET | Freq: Four times a day (QID) | ORAL | Status: DC

## 2014-08-29 MED ORDER — PENICILLIN V POTASSIUM 500 MG PO TABS: 500.0000 mg | ORAL_TABLET | Freq: Three times a day (TID) | ORAL | Status: DC

## 2014-08-29 MED ORDER — HYDROCODONE-ACETAMINOPHEN 5-325 MG PO TABS
1.0000 | ORAL_TABLET | Freq: Once | ORAL | Status: AC
  Administered 2014-08-29: 1 via ORAL
  Filled 2014-08-29: qty 1

## 2014-08-29 MED ORDER — HYDROCODONE-ACETAMINOPHEN 5-325 MG PO TABS: 1.0000 | ORAL_TABLET | Freq: Four times a day (QID) | ORAL | Status: DC | PRN

## 2014-08-29 NOTE — ED Provider Notes (Signed)
CSN: [id number]     Arrival date & time 08/29/14  0003 History   First MD Initiated Contact with Patient 08/29/14 0013     Chief Complaint  Patient presents with  . Dental Pain   Patient is a 49 y.o. female presenting with tooth pain. The history is provided by the patient. No language interpreter was used.  Dental Pain Location:  Lower Lower teeth location:  22/LL cuspid Quality:  Pulsating, dull and aching Severity:  Severe Onset quality:  Gradual Duration:  2 days Timing:  Constant Progression:  Worsening Chronicity:  New Context comment:  Filling done two weeks ago Previous work-up:  Filled cavity and dental exam Relieved by:  Nothing Worsened by:  Nothing tried Ineffective treatments:  NSAIDs, topical anesthetic gel and ice Associated symptoms: headaches   Associated symptoms: no congestion, no difficulty swallowing, no drooling, no facial pain, no facial swelling, no fever, no gum swelling, no neck pain, no neck swelling, no oral bleeding, no oral lesions and no trismus   Risk factors: no alcohol problem, no cancer, no chewing tobacco use, no diabetes, no immunosuppression, sufficient dental care, no periodontal disease and no smoking     Past Medical History  Diagnosis Date  . Goiter    History reviewed. No pertinent past surgical history. No family history on file. History  Substance Use Topics  . Smoking status: Never Smoker   . Smokeless tobacco: Not on file  . Alcohol Use: No   OB History    No data available     Review of Systems  Constitutional: Negative for fever, chills and fatigue.  HENT: Negative for congestion, drooling, facial swelling, mouth sores and trouble swallowing.   Respiratory: Negative for chest tightness and shortness of breath.   Cardiovascular: Negative for chest pain and palpitations.  Musculoskeletal: Negative for neck pain.  Neurological: Positive for headaches.  All other systems reviewed and are negative.     Allergies    Codeine  Home Medications   Prior to Admission medications   Medication Sig Start Date End Date Taking? Authorizing Provider  cholecalciferol (VITAMIN D) 1000 UNITS tablet Take 1,000 Units by mouth 2 (two) times daily.    Historical Provider, MD  HYDROcodone-acetaminophen (NORCO/VICODIN) 5-325 MG per tablet Take 1 tablet by mouth every 6 (six) hours as needed. 08/29/14   Brystal Kildow A Forcucci, PA-C  meloxicam (MOBIC) 7.5 MG tablet Take 7.5 mg by mouth daily.    Historical Provider, MD  methylPREDNISolone (MEDROL) 4 MG tablet Take 4 mg by mouth daily.    Historical Provider, MD  ondansetron (ZOFRAN) 4 MG tablet Take 1 tablet (4 mg total) by mouth every 6 (six) hours. 08/29/14   Leila Schuff A Forcucci, PA-C  penicillin v potassium (VEETID) 500 MG tablet Take 1 tablet (500 mg total) by mouth 3 (three) times daily. 08/29/14   Iretha Kirley A Forcucci, PA-C   BP 157/83 mmHg  Pulse 71  Temp(Src) 97.4 F (36.3 C) (Oral)  Resp 18  Ht  (1.626 m)  Wt 180 lb (81.647 kg)  BMI 30.88 kg/m2  SpO2 100% Physical Exam  Constitutional: She is oriented to person, place, and time. She appears well-developed and well-nourished. No distress.  HENT:  Head: Normocephalic and atraumatic.  Mouth/Throat: Uvula is midline, oropharynx is clear and moist and mucous membranes are normal. No oral lesions. No trismus in the jaw. No dental abscesses or uvula swelling.    Eyes: Conjunctivae and EOM are normal. Pupils are equal, round, and reactive  to light. No scleral icterus.  Neck: Normal range of motion. Neck supple. No JVD present. No thyromegaly present.  Cardiovascular: Normal rate, regular rhythm, normal heart sounds and intact distal pulses.  Exam reveals no gallop and no friction rub.   No murmur heard. Pulmonary/Chest: Effort normal and breath sounds normal. No respiratory distress. She has no wheezes. She has no rales. She exhibits no tenderness.  Lymphadenopathy:    She has no cervical adenopathy.   Neurological: She is alert and oriented to person, place, and time.  Skin: Skin is warm and dry. She is not diaphoretic.  Psychiatric: She has a normal mood and affect. Her behavior is normal. Judgment and thought content normal.  Nursing note and vitals reviewed.   ED Course  Procedures (including critical care time) Labs Review Labs Reviewed - No data to display  Imaging Review No results found.   EKG Interpretation None      MDM   Final diagnoses:  Pain, dental   Patient is a 49 year old female who presents emergency room for evaluation of dental pain started yesterday. Patient has recent filling to the tooth. Dentist was concerned for the need for a possible root canal. No evidence for dental abscess at this time. Cannot rule out periapical abscess. No facial swelling, difficulty swallowing, or evidence for Ludwig angina. We'll discharge with penicillin V and was sent home with hydrocodone and Zofran. Patient to follow-up with her dentist. Patient return for facial swelling, intractable nausea and vomiting, and trismus. Patient states understanding and agreement at this time. Patient stable for discharge at this time.   Eben Burowourtney A Forcucci, PA-C 08/29/14 [id number]  Toy CookeyMegan Docherty, MD 08/31/14 1057

## 2014-08-29 NOTE — Discharge Instructions (Signed)
Dental Pain °A tooth ache may be caused by cavities (tooth decay). Cavities expose the nerve of the tooth to air and hot or cold temperatures. It may come from an infection or abscess (also called a boil or furuncle) around your tooth. It is also often caused by dental caries (tooth decay). This causes the pain you are having. °DIAGNOSIS  °Your caregiver can diagnose this problem by exam. °TREATMENT  °· If caused by an infection, it may be treated with medications which kill germs (antibiotics) and pain medications as prescribed by your caregiver. Take medications as directed. °· Only take over-the-counter or prescription medicines for pain, discomfort, or fever as directed by your caregiver. °· Whether the tooth ache today is caused by infection or dental disease, you should see your dentist as soon as possible for further care. °SEEK MEDICAL CARE IF: °The exam and treatment you received today has been provided on an emergency basis only. This is not a substitute for complete medical or dental care. If your problem worsens or new problems (symptoms) appear, and you are unable to meet with your dentist, call or return to this location. °SEEK IMMEDIATE MEDICAL CARE IF:  °· You have a fever. °· You develop redness and swelling of your face, jaw, or neck. °· You are unable to open your mouth. °· You have severe pain uncontrolled by pain medicine. °MAKE SURE YOU:  °· Understand these instructions. °· Will watch your condition. °· Will get help right away if you are not doing well or get worse. °Document Released: 07/27/2005 Document Revised: 10/19/2011 Document Reviewed: 03/14/2008 °ExitCare® Patient Information ©2015 ExitCare, LLC. This information is not intended to replace advice given to you by your health care provider. Make sure you discuss any questions you have with your health care provider. ° °Emergency Department Resource Guide °1) Find a Doctor and Pay Out of Pocket °Although you won't have to find out who  is covered by your insurance plan, it is a good idea to ask around and get recommendations. You will then need to call the office and see if the doctor you have chosen will accept you as a new patient and what types of options they offer for patients who are self-pay. Some doctors offer discounts or will set up payment plans for their patients who do not have insurance, but you will need to ask so you aren't surprised when you get to your appointment. ° °2) Contact Your Local Health Department °Not all health departments have doctors that can see patients for sick visits, but many do, so it is worth a call to see if yours does. If you don't know where your local health department is, you can check in your phone book. The CDC also has a tool to help you locate your state's health department, and many state websites also have listings of all of their local health departments. ° °3) Find a Walk-in Clinic °If your illness is not likely to be very severe or complicated, you may want to try a walk in clinic. These are popping up all over the country in pharmacies, drugstores, and shopping centers. They're usually staffed by nurse practitioners or physician assistants that have been trained to treat common illnesses and complaints. They're usually fairly quick and inexpensive. However, if you have serious medical issues or chronic medical problems, these are probably not your best option. ° °No Primary Care Doctor: °- Call Health Connect at  832-8000 - they can help you locate a primary   care doctor that  accepts your insurance, provides certain services, etc. °- Physician Referral Service- 1-800-533-3463 ° °Chronic Pain Problems: °Organization         Address  Phone   Notes  ° [facility]  (336) 297-2271 Patients need to be referred by their primary care doctor.  ° °Medication Assistance: °Organization         Address  Phone   Notes  °[place] [facility] [address]  [address] °[place], [place] [address] (336) 641-8030 --Must be a resident of [place] °-- Must have NO insurance coverage whatsoever (no Medicaid/ Medicare, etc.) °-- The pt. MUST have a primary care doctor that directs their care regularly and follows them in the community °  °[facility]  (866) 331-1348   °United Way  (888) 892-1162   ° °Agencies that provide inexpensive medical care: °Organization         Address  Phone   Notes  °[facility]  (336) 832-8035   °[facility]    (336) 832-7272   °[facility] [address] °[place], [place] [address] (336) 832-4777   °[facility] [address], °[place] (336) 271-4999   °[facility]    (336) 373-0678   °[facility]    (336) 272-1050   °[facility] ° [address], [place] Phone:  (336) 832-4444, Fax:  (336) 832-4440 Hours of Operation:  9 am - 6 pm, M-F.  Also accepts Medicaid/Medicare and self-pay.  °[facility] ° [address], [place] Phone: (336) 832-3150, Fax: (336) [phone]. Hours of Operation:  8:30 am - 5:30 pm, M-F.  Also accepts Medicaid and self-pay.  °[facility] [place] [address], [place] Phone: (336) 878-6027   °[facility] [address], [place], [place] (336)723-1848, Ext. 123 Mondays & Thursdays: 7-9 AM.  First 15 patients are seen on a first come, first serve basis. °  ° °Medicaid-accepting [place] Providers: ° °Organization         Address  Phone   Notes  °[facility] [address], [place] (336) 641-2100 Also accepts self-pay patients.  °[facility] [address], [place] ° (336) 856-9996   °[facility] [address], [place] (336) 288-8857   °[facility] [address] [place] [address], [place] (336) 299-7000   °Veita Bland [address], [place]  ° (336) 373-1557 Only accepts [place] Access Medicaid patients after they have their name applied to their card.  ° °Self-Pay (no insurance) in [place]: ° °Organization         Address  Phone   Notes  °Sickle Cell Patients, [facility] [address], [place] (336) 832-1970   °[facility] [address], [place] (336) 832-4400   °[facility] ° [address], [place] (336) 992-4800   °[facility]/Dr. Osei-Bonsu ° [address] [place] [address], [place] or [address], [place] (336) 841-8500 Phone number for both [place] and [place] locations is the same.  °[facility] [address], [place] (336) 299-0000   °[facility] [address] [place] [address], [place] or [address] (336) 852-7530 °(336) 878-2260   °  [facility] [address], [place] (336) 350-1642, phone; (336) 294-5005, fax Sees patients 1st and 3rd Saturday of every month.  Must not qualify for public or private insurance (i.e. Medicaid, Medicare, [place] Health Choice, Veterans' Benefits) • Household income should be no more than 200% of the poverty level •The clinic cannot treat you if you are pregnant or think you are pregnant • Sexually transmitted diseases are not treated at the clinic.  ° ° °Dental Care: °Organization         Address  Phone  Notes  °[place] [facility] [address], [place] (336) 641-6152 Accepts children up to age 21 who are enrolled in Medicaid or [place] Health Choice; pregnant women with a Medicaid card; and children who have applied for Medicaid or [place] Health Choice, but were declined, whose parents can pay a reduced fee at time of service.  °[facility] [place]  [address], [place] (336) 641-7733 Accepts children up to age 21 who are  enrolled in Medicaid or [place] Health Choice; pregnant women with a Medicaid card; and children who have applied for Medicaid or [place] Health Choice, but were declined, whose parents can pay a reduced fee at time of service.  °[facility] ° [address], [place] (336) 641-4533 Patients are seen by appointment only. Walk-ins are not accepted. [facility] will see patients 18 years of age and older. °Monday - Tuesday (8am-5pm) °Most Wednesdays (8:30-5pm) °$30 per visit, cash only  °[facility] ° [address], [place] (336) 641-4533 Patients are seen by appointment only. Walk-ins are not accepted. [facility] will see patients 18 years of age and older. °One Wednesday Evening (Monthly: Volunteer Based).  $30 per visit, cash only  °[facility]  (919) 537-3737 for adults; Children under age 4, call Graduate Pediatric Dentistry at ([phone]) [phone]. Children aged 4-14, please call ([phone]) [phone] to request a pediatric application. ° Dental services are provided in all areas of dental care including fillings, crowns and bridges, complete and partial dentures, implants, gum treatment, root canals, and extractions. Preventive care is also provided. Treatment is provided to both adults and children. °Patients are selected via a lottery and there is often a waiting list. °  °[facility] [address], °[place] ° (336) 763-8833 [email or web address] °  °[facility] [address], [place], [place] (336)723-1848, Ext. 123 Second and Fourth Thursday of each month, opens at 6:30 AM; Clinic ends at 9 AM.  Patients are seen on a first-come first-served basis, and a limited number are seen during each clinic.  ° °[facility] ° [address], [place], [place] (336) 723-7904   Eligibility Requirements °You must have lived in [place], [place], or [place] counties for at least the last three months. °  You  cannot be eligible for state or federal sponsored healthcare insurance, including Veterans Administration, Medicaid, or Medicare. °  You generally cannot be eligible for healthcare insurance through your employer.  °  How to apply: °Eligibility screenings are held every Tuesday and Wednesday afternoon from 1:00 pm until 4:00 pm. You do not need an appointment for the interview!  °[facility] [address], [place], [place] 336-631-2330   °[place] [facility]  336-342-8273   °[place]  336-703-3100   °[facility]  336-570-6415   ° °Behavioral Health Resources in the Community: °Intensive Outpatient Programs °Organization         Address  Phone  Notes  °[facility] [address], [place], [place] 336-878-6098   °[facility] [address], [place], [place] 336-832-9800   °[facility] [address], [place], [place] ° 336-882-2125   °[facility] [address],  °[place], [place] 1-800-853-5163 or 336-641-4981   °Substance Abuse Resources °Organization         Address  Phone  Notes  °[facility]  336-882-2125   °[facility]  336-784-9470   °[facility] [place] [facility]  336-285-9073   °[facility]  336-845-3988   °Residential & Outpatient Substance Abuse Program  1-800-659-3381   °Psychological Services °Organization         Address  Phone  Notes  °[facility]  336- 832-9600   °[facility]  336- 378-7881   °[facility] [address], [place] 1-800-853-5163 or 336-641-4981   ° °Mobile Crisis Teams °Organization         Address  Phone  Notes  °[facility], Mobile Crisis Care Unit  1-877-626-1772   °Assertive °[facility] ° [address] [place], [place] 336-834-9664   °Sharon DeEsch [address] °[place] 336-554-5454   ° °Self-Help/Support  Groups °Organization         Address  Phone             Notes  °[facility] [place] - variety of support groups  336- 373-1402 Call for more information  °Narcotics Anonymous (NA), [facility] [address], °[place]  2 meetings at this location  ° °Residential Treatment Programs °Organization         Address  Phone  Notes  °[facility] [address],    °[place]  1-866-801-8205   °[facility] ° [address], [place], [place] 704-293-8524   °[facility] [address], [place] 336-845-3988 Admissions: 8am-3pm M-F  °[facility] [address]    °[place], [place] 336-841-1104   °[facility] [address], [place], [place] 336-379-7146   °[facility] [place] [facility] [address]  °[place], [place] 336-285-9073   °[facility] Intensive Outpatient [address], [place], [place] 336-852-3033   °[facility] [address]  °[place], [place] 1-877-615-2722 or 336-784-9470   °[facility] ([facility]) [address] [place], [place] 336-227-7417 Accepts Medicaid  °[facility] [address]  °[place] 1-800-659-3381 Substance Abuse/Addiction Treatment  ° °[place] Behavioral Health Resources °Organization         Address  Phone  Notes  °[facility]  (888) 581-9988   °Julie Brannon, PhD [address] [place], [place]   (336) 349-5553 or (336) 951-0000   °[facility]   [address] °[place],  (336) 349-4454   °[facility] [address], [place],  (336) 342-8316 Insurance/Medicaid/sponsorship through [facility]  °[facility] [address]                                    [place],  (336) 342-8316 Therapy/tele-psych/case  °[facility]   [address]  ° [place], [place] (336) 349-2233    °Dr. Arfeen  (336) 349-4544   °[facility] [place]  [employer] [place] [facility] 1) [address], [place] °2) [address], [place] °3)  [address], [place] (336) 349-3220 °(336) 342-7768 ° °(336) 342-8140   °[place] Child Abuse Hotline (336) 342-1394 or (336) 342-3537 (After Hours)    ° ° ° °

## 2014-08-29 NOTE — ED Notes (Signed)
PA Courtney at bedside.  

## 2014-08-29 NOTE — ED Notes (Signed)
Pt. reports left lower molar pain onset yesterday worse this evening .

## 2014-08-29 NOTE — ED Notes (Signed)
Pt made aware to return if symptoms worsen or if any life threatening symptoms occur.   

## 2014-08-30 ENCOUNTER — Emergency Department (HOSPITAL_COMMUNITY)
Admission: EM | Admit: 2014-08-30 | Discharge: 2014-08-30 | Disposition: A | Discharge status: Home / Self Care | Payer: 59 | Attending: Emergency Medicine | Admitting: Emergency Medicine

## 2014-08-30 ENCOUNTER — Encounter (HOSPITAL_COMMUNITY): Payer: Self-pay | Admitting: Emergency Medicine

## 2014-08-30 DIAGNOSIS — Z79899 Other long term (current) drug therapy: Secondary | ICD-10-CM | POA: Diagnosis not present

## 2014-08-30 DIAGNOSIS — K0889 Other specified disorders of teeth and supporting structures: Secondary | ICD-10-CM

## 2014-08-30 DIAGNOSIS — Z8639 Personal history of other endocrine, nutritional and metabolic disease: Secondary | ICD-10-CM | POA: Insufficient documentation

## 2014-08-30 DIAGNOSIS — K088 Other specified disorders of teeth and supporting structures: Secondary | ICD-10-CM | POA: Diagnosis present

## 2014-08-30 DIAGNOSIS — Z791 Long term (current) use of non-steroidal anti-inflammatories (NSAID): Secondary | ICD-10-CM | POA: Diagnosis not present

## 2014-08-30 MED ORDER — PROMETHAZINE HCL 25 MG PO TABS: 25.0000 mg | ORAL_TABLET | Freq: Four times a day (QID) | ORAL | Status: DC | PRN

## 2014-08-30 MED ORDER — KETOROLAC TROMETHAMINE 30 MG/ML IJ SOLN
60.0000 mg | Freq: Once | INTRAMUSCULAR | Status: AC
  Administered 2014-08-30: 60 mg via INTRAMUSCULAR
  Filled 2014-08-30: qty 2

## 2014-08-30 MED ORDER — ONDANSETRON 4 MG PO TBDP
8.0000 mg | ORAL_TABLET | Freq: Once | ORAL | Status: AC
  Administered 2014-08-30: 8 mg via ORAL
  Filled 2014-08-30: qty 2

## 2014-08-30 MED ORDER — OXYCODONE-ACETAMINOPHEN 5-325 MG PO TABS: 2.0000 | ORAL_TABLET | Freq: Four times a day (QID) | ORAL | Status: DC | PRN

## 2014-08-30 MED ORDER — OXYCODONE-ACETAMINOPHEN 5-325 MG PO TABS
2.0000 | ORAL_TABLET | Freq: Once | ORAL | Status: AC
  Administered 2014-08-30: 2 via ORAL
  Filled 2014-08-30: qty 2

## 2014-08-30 NOTE — Discharge Instructions (Signed)
Please follow up with a dentist today for further evaluation of your tooth pain.  Unfortunately in the ER we are not able to pull teeth or do dental procedures.   Dental Pain A tooth ache may be caused by cavities (tooth decay). Cavities expose the nerve of the tooth to air and hot or cold temperatures. It may come from an infection or abscess (also called a boil or furuncle) around your tooth. It is also often caused by dental caries (tooth decay). This causes the pain you are having. DIAGNOSIS  Your caregiver can diagnose this problem by exam. TREATMENT   If caused by an infection, it may be treated with medications which kill germs (antibiotics) and pain medications as prescribed by your caregiver. Take medications as directed.  Only take over-the-counter or prescription medicines for pain, discomfort, or fever as directed by your caregiver.  Whether the tooth ache today is caused by infection or dental disease, you should see your dentist as soon as possible for further care. SEEK MEDICAL CARE IF: The exam and treatment you received today has been provided on an emergency basis only. This is not a substitute for complete medical or dental care. If your problem worsens or new problems (symptoms) appear, and you are unable to meet with your dentist, call or return to this location. SEEK IMMEDIATE MEDICAL CARE IF:   You have a fever.  You develop redness and swelling of your face, jaw, or neck.  You are unable to open your mouth.  You have severe pain uncontrolled by pain medicine. MAKE SURE YOU:   Understand these instructions.  Will watch your condition.  Will get help right away if you are not doing well or get worse. Document Released: 07/27/2005 Document Revised: 10/19/2011 Document Reviewed: 03/14/2008 [facility] Patient Information 2015 [place], Maryland[place]. This information is not intended to replace advice given to you by your health care provider. Make sure you discuss any  questions you have with your health care provider.    Dental Care: Organization         Address  Phone  Notes  [facility] [address], [place] [phone]) [phone] Accepts children up to age 48 who are enrolled in [place] or [place] Health Choice; pregnant women with a Medicaid card; and children who have applied for Medicaid or [place] Health Choice, but were declined, whose parents can pay a reduced fee at time of service.  [facility] [place]  [address], [place] [phone]) [phone] Accepts children up to age 48 who are enrolled in [place] or [place] Health Choice; pregnant women with a Medicaid card; and children who have applied for Medicaid or [place] Health Choice, but were declined, whose parents can pay a reduced fee at time of service.  [facility]  [address], [place] [phone](336) ([phone] Patients are seen by appointment only. Walk-ins are not accepted. [facility] will see patients 48 years of age and older. Monday - Tuesday (8am-5pm) Most Wednesdays (8:30-5pm) $30 per visit, cash only  [facility]  [address], [place] [facility] [place] (612)[phone](336) [phone] Patients are seen by appointment only. Walk-ins are not accepted. [facility] will see patients 218 years of age and older. One Wednesday Evening (Monthly: Volunteer Based).  $30 per visit, cash only  [facility]  [phone]) [phone] for adults; Children under age 744, call Graduate Pediatric  Dentistry at [phone]. Children aged 18-14, please call [phone] to request a pediatric application.  Dental services are provided in all areas of dental care including fillings, crowns and bridges, complete and partial dentures, implants, gum treatment, root canals, and extractions. Preventive care is also provided. Treatment is provided to both adults and children. Patients are  selected via a lottery and there is often a waiting list.   [facility] [address], [place]  [phone] [email or web address]   [facility] [address] [place], [place] [phone], Ext. [phone] Second and Fourth Thursday of each month, opens at 6:30 AM; Clinic ends at 9 AM.  Patients are seen on a first-come first-served basis, and a limited number are seen during each clinic.   [facility]  [address] [place], [place] [phone]   Eligibility Requirements You must have lived in [place], [place], or [place] counties for at least the last three months.   You cannot be eligible for state or federal sponsored [place], including [employer], [place], or [employer].   You generally cannot be eligible for healthcare insurance through your employer.    How to apply: Eligibility screenings are held every Tuesday and Wednesday afternoon from 1:00 pm until 4:00 pm. You do not need an appointment for the interview!  [facility] [address], [place], [place] [phone]   [facility]  [phone]   [facility]  [phone]   [facility]  [phone]

## 2014-08-30 NOTE — ED Notes (Signed)
Pt arrives with dental pain since Tuesday, was seen last night for same and states medications given last night not effective.

## 2014-08-30 NOTE — ED Provider Notes (Signed)
CSN: [id number]     Arrival date & time 08/30/14  0409 History   First MD Initiated Contact with Patient 08/30/14 0445     Chief Complaint  Patient presents with  . Dental Pain     (Consider location/radiation/quality/duration/timing/severity/associated sxs/prior Treatment) HPI 49 yo female returns to the ER with continued left lower tooth pain.  Pt s/p filling by local dentist 2 weeks ago, and has been having pain in filled tooth since Tuesday.  She reports no sleep, unable to control pain with ibuprofen, topical solutions or vicodin.  Pt was told she may need root canal.  Wishes to see another dentist as she does not feel the prior one did a good job.  No fevers, swelling of jaw, difficulties eating or drinking.  Past Medical History  Diagnosis Date  . Goiter    History reviewed. No pertinent past surgical history. No family history on file. History  Substance Use Topics  . Smoking status: Never Smoker   . Smokeless tobacco: Not on file  . Alcohol Use: No   OB History    No data available     Review of Systems  HENT: Positive for dental problem.   All other systems reviewed and are negative.     Allergies  Codeine  Home Medications   Prior to Admission medications   Medication Sig Start Date End Date Taking? Authorizing Provider  cholecalciferol (VITAMIN D) 1000 UNITS tablet Take 1,000 Units by mouth 2 (two) times daily.   Yes Historical Provider, MD  ondansetron (ZOFRAN) 4 MG tablet Take 1 tablet (4 mg total) by mouth every 6 (six) hours. 08/29/14  Yes Courtney A Forcucci, PA-C  penicillin v potassium (VEETID) 500 MG tablet Take 1 tablet (500 mg total) by mouth 3 (three) times daily. 08/29/14  Yes Courtney A Forcucci, PA-C  meloxicam (MOBIC) 7.5 MG tablet Take 7.5 mg by mouth daily.    Historical Provider, MD  methylPREDNISolone (MEDROL) 4 MG tablet Take 4 mg by mouth daily.    Historical Provider, MD  oxyCODONE-acetaminophen (PERCOCET/ROXICET) 5-325 MG per tablet  Take 2 tablets by mouth every 6 (six) hours as needed for severe pain. 08/30/14   Olivia Mackielga M Malcom Selmer, MD  promethazine (PHENERGAN) 25 MG tablet Take 1 tablet (25 mg total) by mouth every 6 (six) hours as needed for nausea or vomiting. 08/30/14   Olivia Mackielga M Nyan Dufresne, MD   BP 144/91 mmHg  Pulse 84  Temp(Src) 97.6 F (36.4 C) (Oral)  Resp 18  SpO2 100% Physical Exam  Constitutional: She appears well-developed and well-nourished. She appears distressed.  HENT:  Head: Normocephalic and atraumatic.  Right Ear: External ear normal.  Left Ear: External ear normal.  Nose: Nose normal.  Mouth/Throat: Oropharynx is clear and moist.    Cardiovascular: Normal rate, regular rhythm, normal heart sounds and intact distal pulses.  Exam reveals no gallop and no friction rub.   No murmur heard. Pulmonary/Chest: Effort normal and breath sounds normal. No respiratory distress. She has no wheezes. She has no rales. She exhibits no tenderness.  Nursing note and vitals reviewed.   ED Course  Procedures (including critical care time) Labs Review Labs Reviewed - No data to display  Imaging Review No results found.   EKG Interpretation None      MDM   Final diagnoses:  Pain, dental    49 yo female with persistent tooth pain.  Will switch to percocet, have patient f/u with dentist.    Olivia Mackielga M Angellina Ferdinand, MD  08/30/14 0515 

## 2015-06-07 ENCOUNTER — Other Ambulatory Visit: Payer: Self-pay | Admitting: Internal Medicine

## 2015-06-07 ENCOUNTER — Ambulatory Visit
Admission: RE | Admit: 2015-06-07 | Discharge: 2015-06-07 | Disposition: A | Discharge status: Home / Self Care | Payer: 59 | Source: Ambulatory Visit | Attending: Internal Medicine | Admitting: Internal Medicine

## 2015-06-07 DIAGNOSIS — M5440 Lumbago with sciatica, unspecified side: Secondary | ICD-10-CM

## 2015-11-29 ENCOUNTER — Other Ambulatory Visit: Payer: Self-pay | Admitting: Sports Medicine

## 2015-11-29 DIAGNOSIS — R52 Pain, unspecified: Secondary | ICD-10-CM

## 2015-12-05 ENCOUNTER — Ambulatory Visit
Admission: RE | Admit: 2015-12-05 | Discharge: 2015-12-05 | Disposition: A | Discharge status: Home / Self Care | Payer: 59 | Source: Ambulatory Visit | Attending: Sports Medicine | Admitting: Sports Medicine

## 2015-12-05 DIAGNOSIS — R52 Pain, unspecified: Secondary | ICD-10-CM

## 2016-04-23 ENCOUNTER — Other Ambulatory Visit: Payer: Self-pay | Admitting: Internal Medicine

## 2016-04-23 ENCOUNTER — Other Ambulatory Visit (HOSPITAL_COMMUNITY)
Admission: RE | Admit: 2016-04-23 | Discharge: 2016-04-23 | Disposition: A | Discharge status: Home / Self Care | Payer: 59 | Source: Ambulatory Visit

## 2016-04-23 DIAGNOSIS — Z01411 Encounter for gynecological examination (general) (routine) with abnormal findings: Secondary | ICD-10-CM | POA: Diagnosis present

## 2016-04-23 DIAGNOSIS — Z1151 Encounter for screening for human papillomavirus (HPV): Secondary | ICD-10-CM | POA: Insufficient documentation

## 2016-04-27 LAB — CYTOLOGY - PAP

## 2016-09-23 DIAGNOSIS — M25562 Pain in left knee: Secondary | ICD-10-CM | POA: Diagnosis not present

## 2016-09-23 DIAGNOSIS — M25571 Pain in right ankle and joints of right foot: Secondary | ICD-10-CM | POA: Diagnosis not present

## 2016-09-23 DIAGNOSIS — M79671 Pain in right foot: Secondary | ICD-10-CM | POA: Diagnosis not present

## 2016-11-16 DIAGNOSIS — G894 Chronic pain syndrome: Secondary | ICD-10-CM | POA: Diagnosis not present

## 2016-11-16 DIAGNOSIS — M1990 Unspecified osteoarthritis, unspecified site: Secondary | ICD-10-CM | POA: Diagnosis not present

## 2016-11-18 DIAGNOSIS — M545 Low back pain: Secondary | ICD-10-CM | POA: Diagnosis not present

## 2017-04-30 ENCOUNTER — Other Ambulatory Visit (HOSPITAL_COMMUNITY)
Admission: RE | Admit: 2017-04-30 | Discharge: 2017-04-30 | Disposition: A | Discharge status: Home / Self Care | Payer: 59 | Source: Ambulatory Visit | Attending: Internal Medicine | Admitting: Internal Medicine

## 2017-04-30 ENCOUNTER — Other Ambulatory Visit: Payer: Self-pay | Admitting: Internal Medicine

## 2017-04-30 DIAGNOSIS — Z124 Encounter for screening for malignant neoplasm of cervix: Secondary | ICD-10-CM | POA: Insufficient documentation

## 2017-04-30 DIAGNOSIS — Z23 Encounter for immunization: Secondary | ICD-10-CM | POA: Diagnosis not present

## 2017-04-30 DIAGNOSIS — Z0000 Encounter for general adult medical examination without abnormal findings: Secondary | ICD-10-CM | POA: Diagnosis not present

## 2017-05-05 LAB — CYTOLOGY - PAP
Chlamydia: NEGATIVE
DIAGNOSIS: NEGATIVE
HPV: NOT DETECTED
Neisseria Gonorrhea: NEGATIVE

## 2017-05-11 DIAGNOSIS — Z23 Encounter for immunization: Secondary | ICD-10-CM | POA: Diagnosis not present

## 2017-05-25 DIAGNOSIS — Z1231 Encounter for screening mammogram for malignant neoplasm of breast: Secondary | ICD-10-CM | POA: Diagnosis not present

## 2017-06-01 DIAGNOSIS — I10 Essential (primary) hypertension: Secondary | ICD-10-CM | POA: Diagnosis not present

## 2017-06-01 DIAGNOSIS — G894 Chronic pain syndrome: Secondary | ICD-10-CM | POA: Diagnosis not present

## 2017-08-07 IMAGING — CR DG LUMBAR SPINE COMPLETE 4+V
5 series · 5 of 5 positions shown · non-contrast
Comparison: None.

CLINICAL DATA: Bilateral leg pain for several weeks. No known
injury. Initial encounter.

EXAM:
LUMBAR SPINE - COMPLETE 4+ VIEW

[t l-spine a.p.]
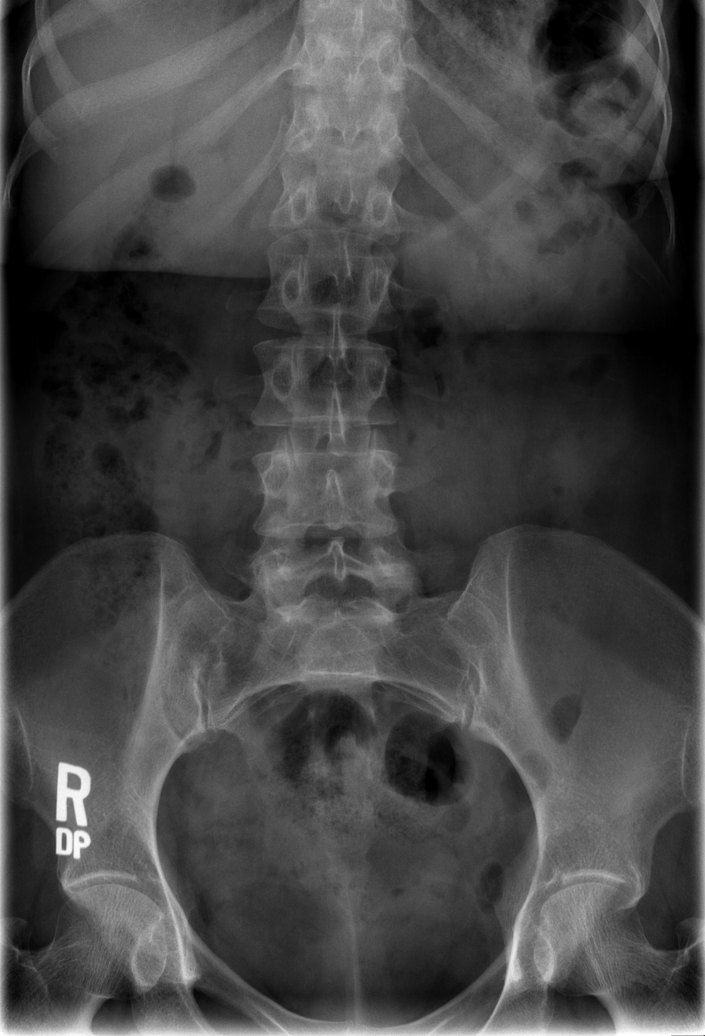

[t l-spine oblique exposure (1 of 2)]
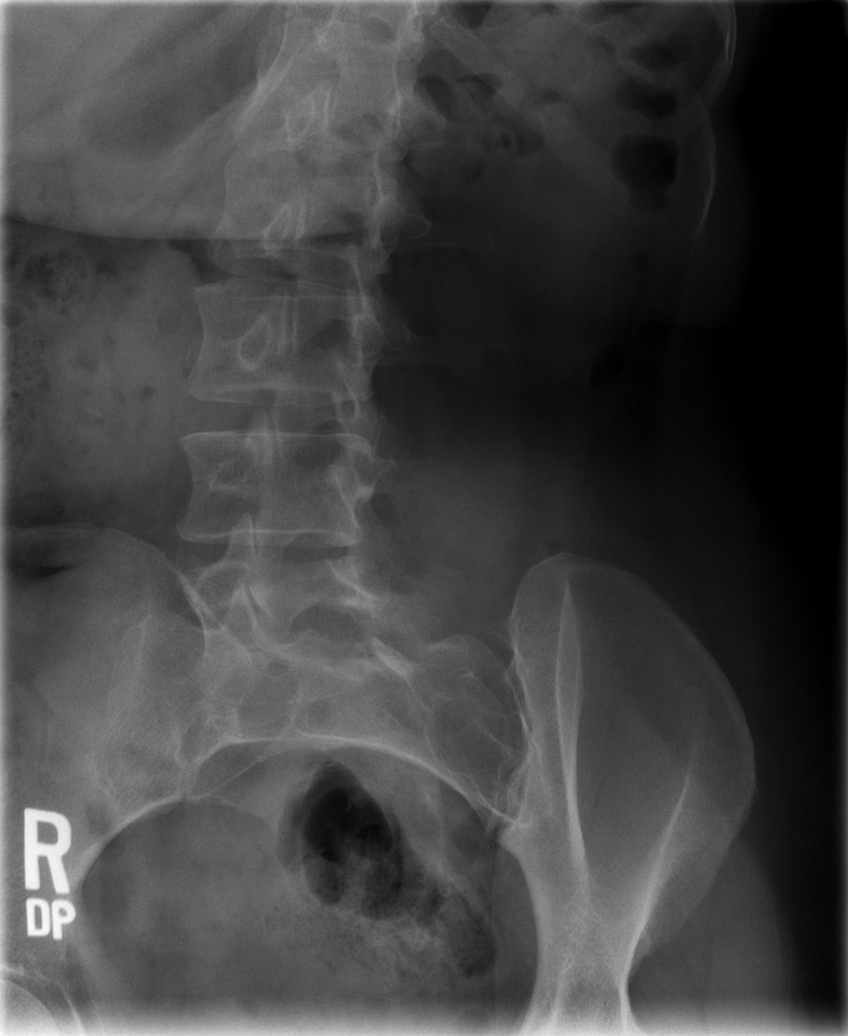

[t l-spine oblique exposure (2 of 2)]
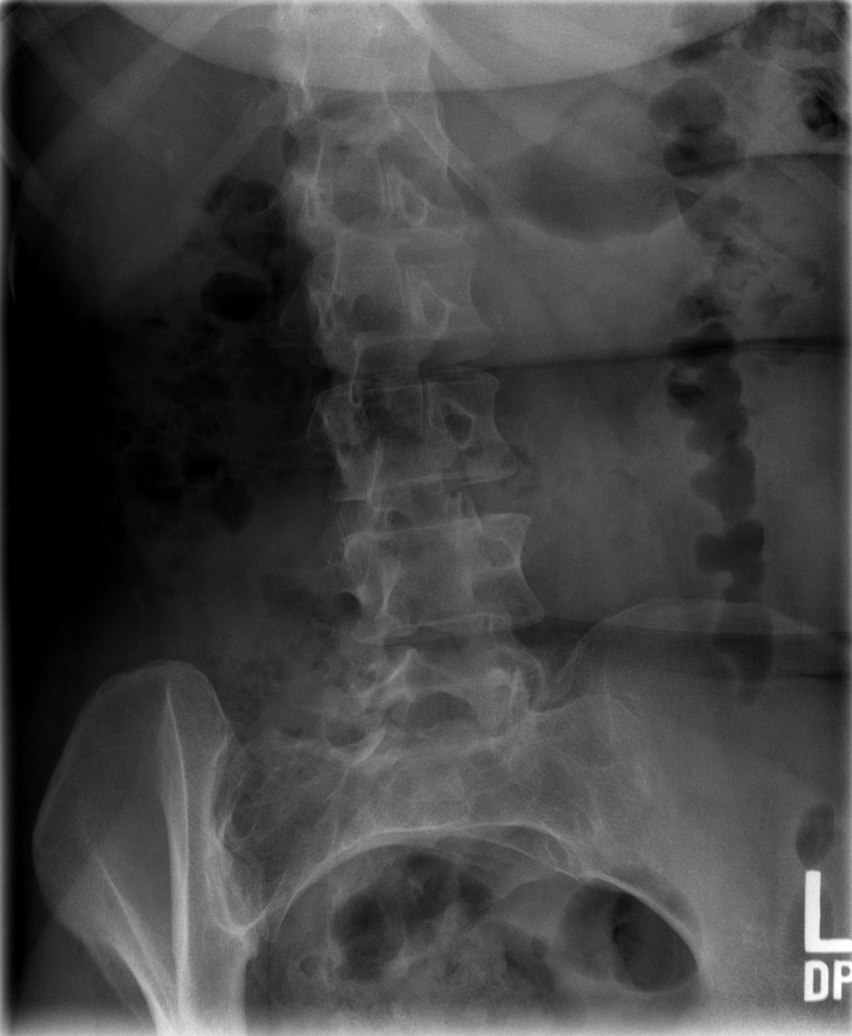

[t l-spine lat]
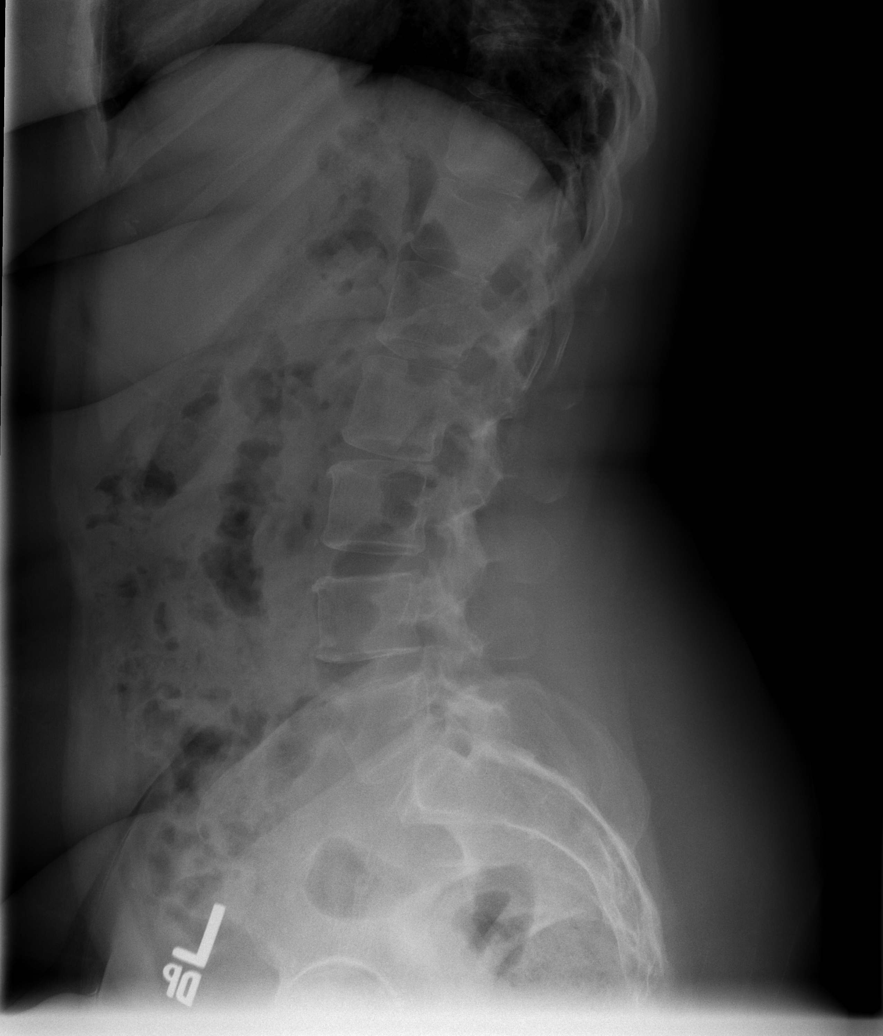

[t l-spine l5-s1 spot]
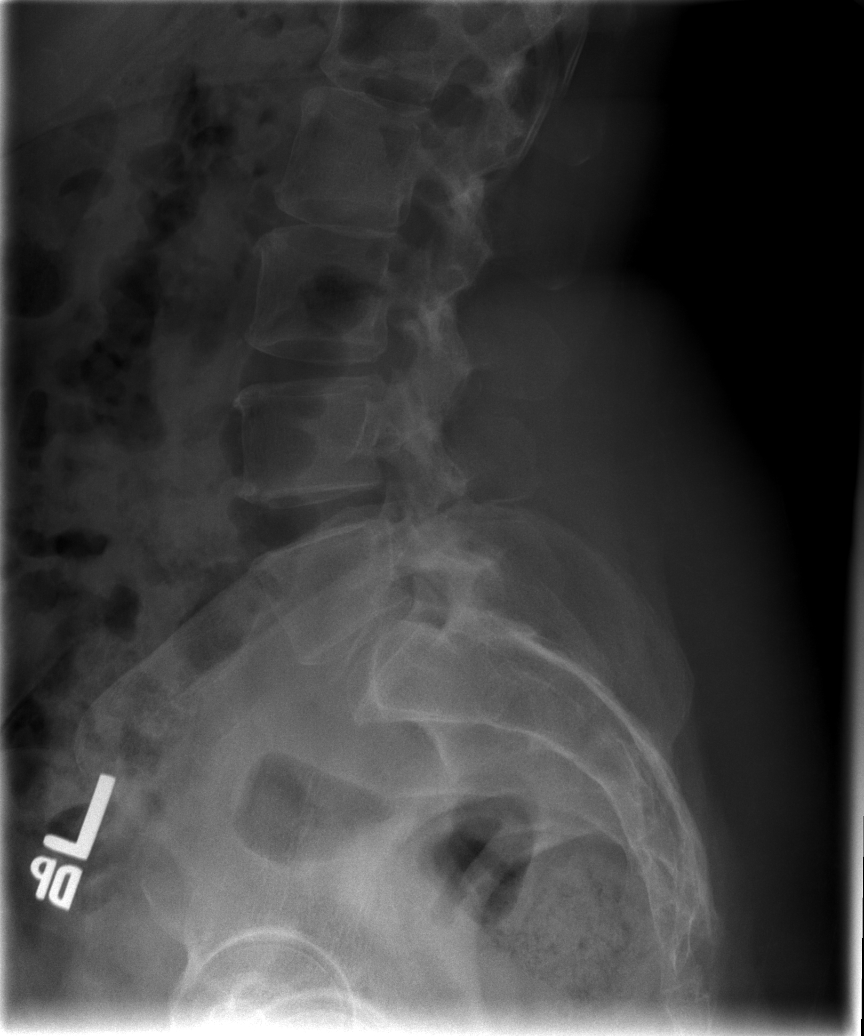

[5 of 5 positions shown; findings below may reference images not displayed]

FINDINGS: Vertebral body height and alignment are maintained. Mild anterior
endplate spurring is seen but intervertebral disc space height is
normal. No pars interarticularis defect is identified. Paraspinous
structures are unremarkable. Mild convex right curvature is noted.
IMPRESSION: No acute abnormality.

Mild degenerative change.

Mild convex right curvature.

## 2017-10-22 DIAGNOSIS — R51 Headache: Secondary | ICD-10-CM | POA: Diagnosis not present

## 2017-10-22 DIAGNOSIS — J9801 Acute bronchospasm: Secondary | ICD-10-CM | POA: Diagnosis not present

## 2017-12-06 ENCOUNTER — Other Ambulatory Visit: Payer: Self-pay

## 2017-12-06 ENCOUNTER — Encounter (HOSPITAL_COMMUNITY): Payer: Self-pay | Admitting: Emergency Medicine

## 2017-12-06 ENCOUNTER — Ambulatory Visit (HOSPITAL_COMMUNITY)
Admission: EM | Admit: 2017-12-06 | Discharge: 2017-12-06 | Disposition: A | Discharge status: Home / Self Care | Payer: 59 | Attending: Family Medicine | Admitting: Family Medicine

## 2017-12-06 DIAGNOSIS — J40 Bronchitis, not specified as acute or chronic: Secondary | ICD-10-CM | POA: Diagnosis not present

## 2017-12-06 MED ORDER — BENZONATATE 100 MG PO CAPS: 100.0000 mg | ORAL_CAPSULE | Freq: Three times a day (TID) | ORAL | 0 refills | Status: DC

## 2017-12-06 MED ORDER — DOXYCYCLINE HYCLATE 100 MG PO CAPS: 100.0000 mg | ORAL_CAPSULE | Freq: Two times a day (BID) | ORAL | 0 refills | Status: DC

## 2017-12-06 NOTE — ED Provider Notes (Signed)
[facility]    CSN: [id number] Arrival date & time: 12/06/17  1829     History   Chief Complaint Chief Complaint  Patient presents with  . URI    HPI Tammy Dixon is a 52 y.o. female.   3-day history of continual cough low-grade fever.  Also complains of headache sore throat.  HPI  Past Medical History:  Diagnosis Date  . Goiter     Patient Active Problem List   Diagnosis Date Noted  . Bronchitis 12/06/2017  . Left thyroid nodule 06/21/2013    History reviewed. No pertinent surgical history.  OB History   None      Home Medications    Prior to Admission medications   Medication Sig Start Date End Date Taking? Authorizing Provider  benzonatate (TESSALON) 100 MG capsule Take 1 capsule (100 mg total) by mouth 3 (three) times daily. 12/06/17   Frederica Kuster, MD  cholecalciferol (VITAMIN D) 1000 UNITS tablet Take 1,000 Units by mouth 2 (two) times daily.    [provider]  doxycycline (VIBRAMYCIN) 100 MG capsule Take 1 capsule (100 mg total) by mouth 2 (two) times daily. 12/06/17   Frederica Kuster, MD  meloxicam (MOBIC) 7.5 MG tablet Take 7.5 mg by mouth daily.    [provider]  methylPREDNISolone (MEDROL) 4 MG tablet Take 4 mg by mouth daily.    [provider]  ondansetron (ZOFRAN) 4 MG tablet Take 1 tablet (4 mg total) by mouth every 6 (six) hours. 08/29/14   Forcucci, Courtney, PA-C  oxyCODONE-acetaminophen (PERCOCET/ROXICET) 5-325 MG per tablet Take 2 tablets by mouth every 6 (six) hours as needed for severe pain. 08/30/14   Marisa Severin, MD  penicillin v potassium (VEETID) 500 MG tablet Take 1 tablet (500 mg total) by mouth 3 (three) times daily. 08/29/14   Forcucci, Courtney, PA-C  promethazine (PHENERGAN) 25 MG tablet Take 1 tablet (25 mg total) by mouth every 6 (six) hours as needed for nausea or vomiting. 08/30/14   Marisa Severin, MD    Family History No family history on file.  Social History Social History    Tobacco Use  . Smoking status: Never Smoker  Substance Use Topics  . Alcohol use: No  . Drug use: Not on file     Allergies   Codeine   Review of Systems Review of Systems  Constitutional: Positive for fever.  HENT: Positive for congestion and sinus pressure.   Respiratory: Positive for cough.      Physical Exam Triage Vital Signs ED Triage Vitals  Enc Vitals Group     BP 12/06/17 1950 (!) 153/95     Pulse Rate 12/06/17 1950 96     Resp 12/06/17 1950 20     Temp 12/06/17 1950 99.3 F (37.4 C)     Temp Source 12/06/17 1950 Oral     SpO2 12/06/17 1950 98 %     Weight --      Height --      Head Circumference --      Peak Flow --      Pain Score 12/06/17 1951 8     Pain Loc --      Pain Edu? --      Excl. in GC? --    No data found.  Updated Vital Signs BP (!) 153/95 (BP Location: Left Arm)   Pulse 96   Temp 99.3 F (37.4 C) (Oral)   Resp 20   SpO2 98%  Visual Acuity Right Eye Distance:   Left Eye Distance:   Bilateral Distance:    Right Eye Near:   Left Eye Near:    Bilateral Near:     Physical Exam  Constitutional: She appears well-developed and well-nourished.  HENT:  Head: Normocephalic.  Right Ear: External ear normal.  Left Ear: External ear normal.  Nose: Nose normal.  Mouth/Throat: Oropharynx is clear and moist.  Cardiovascular: Normal rate, regular rhythm and normal heart sounds.  Pulmonary/Chest: Effort normal. She has wheezes.     UC Treatments / Results  Labs (all labs ordered are listed, but only abnormal results are displayed) Labs Reviewed - No data to display  EKG None  Radiology No results found.  Procedures Procedures (including critical care time)  Medications Ordered in UC Medications - No data to display  Initial Impression / Assessment and Plan / UC Course  I have reviewed the triage vital signs and the nursing notes.  Pertinent labs & imaging results that were available during my care of the patient  were reviewed by me and considered in my medical decision making (see chart for details).     Bronchitis. Rx Doxycycline, Tessalon Plenty fluids Final Clinical Impressions(s) / UC Diagnoses   Final diagnoses:  Bronchitis   Discharge Instructions   None    ED Prescriptions    Medication Sig Dispense Auth. Provider   doxycycline (VIBRAMYCIN) 100 MG capsule Take 1 capsule (100 mg total) by mouth 2 (two) times daily. 20 capsule Frederica Kuster, MD   benzonatate (TESSALON) 100 MG capsule Take 1 capsule (100 mg total) by mouth 3 (three) times daily. 21 capsule Frederica Kuster, MD     Controlled Substance Prescriptions [place] Controlled Substance Registry consulted? No   Frederica Kuster, MD 12/06/17 2014

## 2017-12-06 NOTE — ED Triage Notes (Signed)
Onset Friday night of feeling bad.  Patient has had throat pain, dry, but deep cough.  Patient having hot and cold chills.

## 2017-12-07 DIAGNOSIS — J208 Acute bronchitis due to other specified organisms: Secondary | ICD-10-CM | POA: Diagnosis not present

## 2018-05-06 DIAGNOSIS — Z126 Encounter for screening for malignant neoplasm of bladder: Secondary | ICD-10-CM | POA: Diagnosis not present

## 2018-05-06 DIAGNOSIS — Z0000 Encounter for general adult medical examination without abnormal findings: Secondary | ICD-10-CM | POA: Diagnosis not present

## 2021-08-11 ENCOUNTER — Ambulatory Visit (HOSPITAL_COMMUNITY)
Admission: EM | Admit: 2021-08-11 | Discharge: 2021-08-11 | Disposition: A | Discharge status: Home / Self Care | Payer: 59 | Source: Ambulatory Visit | Attending: Family Medicine | Admitting: Family Medicine

## 2021-08-11 ENCOUNTER — Encounter (HOSPITAL_COMMUNITY): Payer: Self-pay

## 2021-08-11 ENCOUNTER — Other Ambulatory Visit: Payer: Self-pay

## 2021-08-11 DIAGNOSIS — R059 Cough, unspecified: Secondary | ICD-10-CM | POA: Insufficient documentation

## 2021-08-11 DIAGNOSIS — U071 COVID-19: Secondary | ICD-10-CM | POA: Insufficient documentation

## 2021-08-11 DIAGNOSIS — R52 Pain, unspecified: Secondary | ICD-10-CM | POA: Insufficient documentation

## 2021-08-11 DIAGNOSIS — B349 Viral infection, unspecified: Secondary | ICD-10-CM

## 2021-08-11 DIAGNOSIS — J069 Acute upper respiratory infection, unspecified: Secondary | ICD-10-CM

## 2021-08-11 LAB — POC INFLUENZA A AND B ANTIGEN (URGENT CARE ONLY)
INFLUENZA A ANTIGEN, POC: NEGATIVE
INFLUENZA B ANTIGEN, POC: NEGATIVE

## 2021-08-11 MED ORDER — OSELTAMIVIR PHOSPHATE 75 MG PO CAPS: 75.0000 mg | ORAL_CAPSULE | Freq: Two times a day (BID) | ORAL | 0 refills | Status: DC

## 2021-08-11 NOTE — ED Triage Notes (Signed)
Pt presents to the office for body ache,coughing and congestion x 2-3 days.

## 2021-08-11 NOTE — Discharge Instructions (Addendum)
Your flu test was negative.  You have been swabbed for COVID, and the test will result in the next 24 hours. Our staff will call you if positive. If the test is positive, you should quarantine for 5 days.   I have still sent in oseltamivir, to take 1 capsule twice daily, that you can start tomorrow evening if the covid test is negative. If it is positive, you would need a different antiviral sent instead.

## 2021-08-11 NOTE — ED Provider Notes (Addendum)
[facility]    CSN: [id number] Arrival date & time: 08/11/21  1425      History   Chief Complaint Chief Complaint  Patient presents with   Cough   Headache   Generalized Body Aches    HPI Tammy Dixon is a 56 y.o. female.    Cough Associated symptoms: headaches   Headache Associated symptoms: cough   Here with chills,fever, malaise and h/a since yesterday. Some rhinorrhea and cough. No dyspnea. No n/v/d. No h/o asthma.  Past Medical History:  Diagnosis Date   Goiter     Patient Active Problem List   Diagnosis Date Noted   Bronchitis 12/06/2017   Left thyroid nodule 06/21/2013    History reviewed. No pertinent surgical history.  OB History   No obstetric history on file.      Home Medications    Prior to Admission medications   Medication Sig Start Date End Date Taking? Authorizing Provider  oseltamivir (TAMIFLU) 75 MG capsule Take 1 capsule (75 mg total) by mouth every 12 (twelve) hours. 08/11/21  Yes Rachael Ferrie, Janace Aris, MD  benzonatate (TESSALON) 100 MG capsule Take 1 capsule (100 mg total) by mouth 3 (three) times daily. 12/06/17   Frederica Kuster, MD  cholecalciferol (VITAMIN D) 1000 UNITS tablet Take 1,000 Units by mouth 2 (two) times daily.    [provider]  doxycycline (VIBRAMYCIN) 100 MG capsule Take 1 capsule (100 mg total) by mouth 2 (two) times daily. 12/06/17   Frederica Kuster, MD  meloxicam (MOBIC) 7.5 MG tablet Take 7.5 mg by mouth daily.    [provider]  methylPREDNISolone (MEDROL) 4 MG tablet Take 4 mg by mouth daily.    [provider]  ondansetron (ZOFRAN) 4 MG tablet Take 1 tablet (4 mg total) by mouth every 6 (six) hours. 08/29/14   Shirleen Schirmer, PA-C  oxyCODONE-acetaminophen (PERCOCET/ROXICET) 5-325 MG per tablet Take 2 tablets by mouth every 6 (six) hours as needed for severe pain. 08/30/14   Marisa Severin, MD  penicillin v potassium (VEETID) 500 MG tablet Take 1 tablet (500 mg total) by mouth 3  (three) times daily. 08/29/14   Shirleen Schirmer, PA-C  promethazine (PHENERGAN) 25 MG tablet Take 1 tablet (25 mg total) by mouth every 6 (six) hours as needed for nausea or vomiting. 08/30/14   Marisa Severin, MD    Family History History reviewed. No pertinent family history.  Social History Social History   Tobacco Use   Smoking status: Never  Substance Use Topics   Alcohol use: No     Allergies   Codeine   Review of Systems Review of Systems  Respiratory:  Positive for cough.   Neurological:  Positive for headaches.    Physical Exam Triage Vital Signs ED Triage Vitals  Enc Vitals Group     BP 08/11/21 1802 (!) 144/91     Pulse Rate 08/11/21 1802 91     Resp 08/11/21 1802 18     Temp 08/11/21 1802 98.9 F (37.2 C)     Temp Source 08/11/21 1802 Oral     SpO2 08/11/21 1802 100 %     Weight --      Height --      Head Circumference --      Peak Flow --      Pain Score 08/11/21 1803 0     Pain Loc --      Pain Edu? --      Excl. in  GC? --    No data found.  Updated Vital Signs BP (!) 144/91 (BP Location: Left Arm)    Pulse 91    Temp 98.9 F (37.2 C) (Oral)    Resp 18    SpO2 100%   Visual Acuity Right Eye Distance:   Left Eye Distance:   Bilateral Distance:    Right Eye Near:   Left Eye Near:    Bilateral Near:     Physical Exam Vitals reviewed.  Constitutional:      General: She is not in acute distress.    Appearance: She is not toxic-appearing or diaphoretic.  HENT:     Right Ear: Tympanic membrane and ear canal normal.     Left Ear: Tympanic membrane and ear canal normal.     Nose: Congestion present.     Mouth/Throat:     Mouth: Mucous membranes are moist.     Pharynx: No oropharyngeal exudate or posterior oropharyngeal erythema.  Eyes:     Extraocular Movements: Extraocular movements intact.     Pupils: Pupils are equal, round, and reactive to light.  Cardiovascular:     Rate and Rhythm: Normal rate and regular rhythm.     Heart sounds:  No murmur heard. Pulmonary:     Breath sounds: No wheezing, rhonchi or rales.  Musculoskeletal:        General: No tenderness.     Cervical back: Neck supple.  Lymphadenopathy:     Cervical: No cervical adenopathy.  Neurological:     General: No focal deficit present.     Mental Status: She is alert and oriented to person, place, and time.  Psychiatric:        Behavior: Behavior normal.     UC Treatments / Results  Labs (all labs ordered are listed, but only abnormal results are displayed) Labs Reviewed  SARS CORONAVIRUS 2 (TAT 6-24 HRS)  POC INFLUENZA A AND B ANTIGEN (URGENT CARE ONLY)    EKG   Radiology No results found.  Procedures Procedures (including critical care time)  Medications Ordered in UC Medications - No data to display  Initial Impression / Assessment and Plan / UC Course  I have reviewed the triage vital signs and the nursing notes.  Pertinent labs & imaging results that were available during my care of the patient were reviewed by me and considered in my medical decision making (see chart for details).     Flu test negative. Will still treat for IFLI while awaiting covid swab. She will wait to pick it up tomorrow if covid swab is negative. Discussed the reasons for sending in the rx, possibility of false negative flu test Final Clinical Impressions(s) / UC Diagnoses   Final diagnoses:  Viral illness  Viral upper respiratory tract infection     Discharge Instructions      Your flu test was negative.  You have been swabbed for COVID, and the test will result in the next 24 hours. Our staff will call you if positive. If the test is positive, you should quarantine for 5 days.   I have still sent in oseltamivir, to take 1 capsule twice daily, that you can start tomorrow evening if the covid test is negative. If it is positive, you would need a different antiviral sent instead.      ED Prescriptions     Medication Sig Dispense Auth.  Provider   oseltamivir (TAMIFLU) 75 MG capsule Take 1 capsule (75 mg total) by mouth every 12 (  twelve) hours. 10 capsule Zenia Resides, MD      PDMP not reviewed this encounter.   Zenia Resides, MD 08/11/21 1850    Zenia Resides, MD 08/11/21 [phone]

## 2021-08-12 LAB — SARS CORONAVIRUS 2 (TAT 6-24 HRS): SARS Coronavirus 2: POSITIVE — AB

## 2023-01-25 ENCOUNTER — Ambulatory Visit (HOSPITAL_COMMUNITY)
Admission: EM | Admit: 2023-01-25 | Discharge: 2023-01-25 | Disposition: A | Discharge status: Home / Self Care | Payer: 59

## 2023-01-25 ENCOUNTER — Other Ambulatory Visit: Payer: Self-pay

## 2023-01-25 ENCOUNTER — Encounter (HOSPITAL_COMMUNITY): Payer: Self-pay | Admitting: *Deleted

## 2023-01-25 DIAGNOSIS — B029 Zoster without complications: Secondary | ICD-10-CM | POA: Diagnosis not present

## 2023-01-25 DIAGNOSIS — R110 Nausea: Secondary | ICD-10-CM

## 2023-01-25 MED ORDER — VALACYCLOVIR HCL 1 G PO TABS: 1000.0000 mg | ORAL_TABLET | Freq: Three times a day (TID) | ORAL | 0 refills | Status: AC

## 2023-01-25 MED ORDER — ONDANSETRON HCL 4 MG PO TABS: 4.0000 mg | ORAL_TABLET | Freq: Four times a day (QID) | ORAL | 0 refills | Status: AC

## 2023-01-25 NOTE — ED Provider Notes (Signed)
[facility]    CSN: [id number] Arrival date & time: 01/25/23  1823      History   Chief Complaint Chief Complaint  Patient presents with   Rash    HPI Tammy Dixon is a 57 y.o. female.  She reports a rash on her L lower abd and also on L butt cheek.  Denies that it is painful and describes the symptoms she feels as "uncomfortable".  Rash first appeared 01/21/2023.  She did receive the shingles vaccine.  Also c/o nausea today. Last bm yesteday and soft/normal. No vomiting. Feels better with sprite or ginger ale.    Rash   Past Medical History:  Diagnosis Date   Goiter     Patient Active Problem List   Diagnosis Date Noted   Bronchitis 12/06/2017   Left thyroid nodule 06/21/2013    History reviewed. No pertinent surgical history.  OB History   No obstetric history on file.      Home Medications    Prior to Admission medications   Medication Sig Start Date End Date Taking? Authorizing Provider  olmesartan (BENICAR) 20 MG tablet Take 20 mg by mouth daily.   Yes [provider]  ondansetron (ZOFRAN) 4 MG tablet Take 1 tablet (4 mg total) by mouth every 6 (six) hours. 01/25/23  Yes Cathlyn Parsons, NP  valACYclovir (VALTREX) 1000 MG tablet Take 1 tablet (1,000 mg total) by mouth 3 (three) times daily for 7 days. 01/25/23 02/01/23 Yes Cathlyn Parsons, NP    Family History History reviewed. No pertinent family history.  Social History Social History   Tobacco Use   Smoking status: Never  Substance Use Topics   Alcohol use: No     Allergies   Codeine   Review of Systems Review of Systems   Physical Exam Triage Vital Signs ED Triage Vitals  Enc Vitals Group     BP 01/25/23 1836 111/78     Pulse Rate 01/25/23 1836 65     Resp 01/25/23 1836 16     Temp 01/25/23 1836 98.1 F (36.7 C)     Temp src --      SpO2 01/25/23 1836 97 %     Weight --      Height --      Head Circumference --      Peak Flow --      Pain Score  01/25/23 1838 4     Pain Loc --      Pain Edu? --      Excl. in GC? --    No data found.  Updated Vital Signs BP 111/78   Pulse 65   Temp 98.1 F (36.7 C)   Resp 16   SpO2 97%   Visual Acuity Right Eye Distance:   Left Eye Distance:   Bilateral Distance:    Right Eye Near:   Left Eye Near:    Bilateral Near:     Physical Exam Constitutional:      General: She is not in acute distress.    Appearance: Normal appearance. She is not ill-appearing.  Cardiovascular:     Rate and Rhythm: Normal rate and regular rhythm.  Pulmonary:     Effort: Pulmonary effort is normal.     Breath sounds: Normal breath sounds.  Abdominal:     General: Abdomen is flat. Bowel sounds are normal.     Tenderness: There is no abdominal tenderness. There is no guarding or rebound.  Skin:  Findings: Erythema and rash present. Rash is papular and vesicular.  Neurological:     Mental Status: She is alert.      UC Treatments / Results  Labs (all labs ordered are listed, but only abnormal results are displayed) Labs Reviewed - No data to display  EKG   Radiology No results found.  Procedures Procedures (including critical care time)  Medications Ordered in UC Medications - No data to display  Initial Impression / Assessment and Plan / UC Course  I have reviewed the triage vital signs and the nursing notes.  Pertinent labs & imaging results that were available during my care of the patient were reviewed by me and considered in my medical decision making (see chart for details).    Rash appears to be consistent with shingles.  I suspect her mild symptoms and lack of pain are because she also received the shingles vaccine.  I think the nausea is a separate, unrelated problem and I will treat it as separate.  Final Clinical Impressions(s) / UC Diagnoses   Final diagnoses:  Herpes zoster without complication  Nausea without vomiting   Discharge Instructions   None    ED  Prescriptions     Medication Sig Dispense Auth. Provider   valACYclovir (VALTREX) 1000 MG tablet Take 1 tablet (1,000 mg total) by mouth 3 (three) times daily for 7 days. 21 tablet Cathlyn Parsons, NP   ondansetron (ZOFRAN) 4 MG tablet Take 1 tablet (4 mg total) by mouth every 6 (six) hours. 12 tablet Cathlyn Parsons, NP      PDMP not reviewed this encounter.   Cathlyn Parsons, NP 02/02/23 2229

## 2023-01-25 NOTE — ED Triage Notes (Signed)
Pt reports rash to Lt side and Pt has nausea. Pt reports little blisters on rash .
